# Patient Record
Sex: Female | Born: 1940 | Race: White | Hispanic: No | Marital: Single | Smoking: Never smoker
Health system: Southern US, Community
[De-identification: ages and names within clinical notes are randomized; demographics above are authoritative.]

## PROBLEM LIST (undated history)

## (undated) DIAGNOSIS — I10 Essential (primary) hypertension: Secondary | ICD-10-CM

## (undated) DIAGNOSIS — E7800 Pure hypercholesterolemia, unspecified: Secondary | ICD-10-CM

## (undated) DIAGNOSIS — I999 Unspecified disorder of circulatory system: Secondary | ICD-10-CM

## (undated) DIAGNOSIS — E119 Type 2 diabetes mellitus without complications: Secondary | ICD-10-CM

## (undated) HISTORY — PX: VASCULAR SURGERY: SHX849

---

## 2015-06-07 ENCOUNTER — Emergency Department (HOSPITAL_BASED_OUTPATIENT_CLINIC_OR_DEPARTMENT_OTHER)
Admission: EM | Admit: 2015-06-07 | Discharge: 2015-06-07 | Disposition: A | Discharge status: Home / Self Care | Payer: Medicare Other | Source: Home / Self Care | Attending: Emergency Medicine | Admitting: Emergency Medicine

## 2015-06-07 ENCOUNTER — Encounter (HOSPITAL_BASED_OUTPATIENT_CLINIC_OR_DEPARTMENT_OTHER): Payer: Self-pay | Admitting: Emergency Medicine

## 2015-06-07 DIAGNOSIS — I10 Essential (primary) hypertension: Secondary | ICD-10-CM | POA: Diagnosis present

## 2015-06-07 DIAGNOSIS — L0201 Cutaneous abscess of face: Secondary | ICD-10-CM | POA: Diagnosis present

## 2015-06-07 DIAGNOSIS — Z66 Do not resuscitate: Secondary | ICD-10-CM | POA: Diagnosis present

## 2015-06-07 DIAGNOSIS — N179 Acute kidney failure, unspecified: Secondary | ICD-10-CM | POA: Diagnosis not present

## 2015-06-07 DIAGNOSIS — L03211 Cellulitis of face: Secondary | ICD-10-CM | POA: Diagnosis present

## 2015-06-07 DIAGNOSIS — Z7902 Long term (current) use of antithrombotics/antiplatelets: Secondary | ICD-10-CM

## 2015-06-07 DIAGNOSIS — I739 Peripheral vascular disease, unspecified: Secondary | ICD-10-CM | POA: Diagnosis present

## 2015-06-07 DIAGNOSIS — Z8639 Personal history of other endocrine, nutritional and metabolic disease: Secondary | ICD-10-CM | POA: Insufficient documentation

## 2015-06-07 DIAGNOSIS — Z885 Allergy status to narcotic agent status: Secondary | ICD-10-CM

## 2015-06-07 DIAGNOSIS — M549 Dorsalgia, unspecified: Secondary | ICD-10-CM

## 2015-06-07 DIAGNOSIS — J9811 Atelectasis: Secondary | ICD-10-CM | POA: Diagnosis present

## 2015-06-07 DIAGNOSIS — A419 Sepsis, unspecified organism: Principal | ICD-10-CM | POA: Diagnosis present

## 2015-06-07 DIAGNOSIS — Z79899 Other long term (current) drug therapy: Secondary | ICD-10-CM

## 2015-06-07 DIAGNOSIS — E119 Type 2 diabetes mellitus without complications: Secondary | ICD-10-CM

## 2015-06-07 DIAGNOSIS — R51 Headache: Secondary | ICD-10-CM | POA: Insufficient documentation

## 2015-06-07 DIAGNOSIS — K529 Noninfective gastroenteritis and colitis, unspecified: Secondary | ICD-10-CM | POA: Diagnosis present

## 2015-06-07 DIAGNOSIS — E785 Hyperlipidemia, unspecified: Secondary | ICD-10-CM | POA: Diagnosis present

## 2015-06-07 DIAGNOSIS — E780 Pure hypercholesterolemia: Secondary | ICD-10-CM | POA: Diagnosis present

## 2015-06-07 DIAGNOSIS — Z888 Allergy status to other drugs, medicaments and biological substances status: Secondary | ICD-10-CM

## 2015-06-07 HISTORY — DX: Type 2 diabetes mellitus without complications: E11.9

## 2015-06-07 HISTORY — DX: Essential (primary) hypertension: I10

## 2015-06-07 HISTORY — DX: Pure hypercholesterolemia, unspecified: E78.00

## 2015-06-07 HISTORY — DX: Unspecified disorder of circulatory system: I99.9

## 2015-06-07 LAB — CBC WITH DIFFERENTIAL/PLATELET
BASOS ABS: 0 10*3/uL (ref 0.0–0.1)
Basophils Relative: 0 % (ref 0–1)
Eosinophils Absolute: 0 10*3/uL (ref 0.0–0.7)
Eosinophils Relative: 0 % (ref 0–5)
HEMATOCRIT: 31.9 % — AB (ref 36.0–46.0)
HEMOGLOBIN: 9.8 g/dL — AB (ref 12.0–15.0)
Lymphocytes Relative: 9 % — ABNORMAL LOW (ref 12–46)
Lymphs Abs: 1.2 10*3/uL (ref 0.7–4.0)
MCH: 28.1 pg (ref 26.0–34.0)
MCHC: 30.7 g/dL (ref 30.0–36.0)
MCV: 91.4 fL (ref 78.0–100.0)
MONO ABS: 1.3 10*3/uL — AB (ref 0.1–1.0)
MONOS PCT: 9 % (ref 3–12)
Neutro Abs: 11.4 10*3/uL — ABNORMAL HIGH (ref 1.7–7.7)
Neutrophils Relative %: 82 % — ABNORMAL HIGH (ref 43–77)
Platelets: 174 10*3/uL (ref 150–400)
RBC: 3.49 MIL/uL — ABNORMAL LOW (ref 3.87–5.11)
RDW: 15.6 % — ABNORMAL HIGH (ref 11.5–15.5)
WBC: 13.9 10*3/uL — AB (ref 4.0–10.5)

## 2015-06-07 LAB — BASIC METABOLIC PANEL
ANION GAP: 11 (ref 5–15)
BUN: 26 mg/dL — AB (ref 6–20)
CHLORIDE: 101 mmol/L (ref 101–111)
CO2: 26 mmol/L (ref 22–32)
Calcium: 8.3 mg/dL — ABNORMAL LOW (ref 8.9–10.3)
Creatinine, Ser: 1.36 mg/dL — ABNORMAL HIGH (ref 0.44–1.00)
GFR calc non Af Amer: 37 mL/min — ABNORMAL LOW (ref >60)
GFR, EST AFRICAN AMERICAN: 43 mL/min — AB (ref >60)
Glucose, Bld: 140 mg/dL — ABNORMAL HIGH (ref 65–99)
Potassium: 3.7 mmol/L (ref 3.5–5.1)
Sodium: 138 mmol/L (ref 135–145)

## 2015-06-07 MED ORDER — SODIUM CHLORIDE 0.9 % IV SOLN
INTRAVENOUS | Status: DC
  Administered 2015-06-07: 19:00:00 via INTRAVENOUS

## 2015-06-07 MED ORDER — CEPHALEXIN 500 MG PO CAPS: 500.0000 mg | ORAL_CAPSULE | Freq: Four times a day (QID) | ORAL | Status: DC

## 2015-06-07 MED ORDER — DOXYCYCLINE HYCLATE 100 MG PO CAPS: 100.0000 mg | ORAL_CAPSULE | Freq: Two times a day (BID) | ORAL | Status: DC

## 2015-06-07 MED ORDER — VANCOMYCIN HCL IN DEXTROSE 1-5 GM/200ML-% IV SOLN
1000.0000 mg | Freq: Once | INTRAVENOUS | Status: AC
  Administered 2015-06-07: 1000 mg via INTRAVENOUS
  Filled 2015-06-07: qty 200

## 2015-06-07 NOTE — ED Notes (Signed)
Pt states she woke up Thursday with mild swelling noted to left cheek, and swelling has worsened over past 2 days and now today pt has noticed mild bruising appearing on left side of face.

## 2015-06-07 NOTE — ED Provider Notes (Signed)
CSN: [id number]     Arrival date & time 06/07/15  1734 History  This chart was scribed for Vanetta Mulders, MD by Merlene Laughter, ED Scribe. This patient was seen in room MH08/MH08 and the patient's care was started at 6:43 PM.   Chief Complaint  Patient presents with  . Facial Swelling   Patient is a 74 y.o. female presenting with facial injury. The history is provided by the patient. No language interpreter was used.  Facial Injury Location:  L cheek Time since incident:  2 days Pain details:    Quality: swelling.   Severity:  No pain   Timing:  Constant   Progression:  Worsening Chronicity:  New Relieved by:  None tried Worsened by:  Nothing tried Ineffective treatments:  None tried Associated symptoms: headaches   Associated symptoms: no nausea, no rhinorrhea and no vomiting     HPI Comments: Victoria Luna is a 74 y.o. female with a PMHx of hypertension and diabetes, who presents to the Emergency Department complaining of left sided facial swelling onset 2 days ago. Patient states she found a dark spot on the left corner of her lip a few days ago and noticed blood on her pillow this morning. Patient also has a small possible pus pocket on the left side of her neck. Patient denies associated dental pain, fever, trouble swallowing, eye disturbances. Patient has no upper teeth. Pt is currently on Plavix medication. Her PCP is Dr. Derrell Lolling at [facility]. She states she is seen by a dermatologist for removal of skin cancer.  Past Medical History  Diagnosis Date  . Hypertension   . High cholesterol   . Vascular disease   . Diabetes mellitus without complication    Past Surgical History  Procedure Laterality Date  . Vascular surgery     No family history on file. History  Substance Use Topics  . Smoking status: Never Smoker   . Smokeless tobacco: Not on file  . Alcohol Use: No   OB History    No data available     Review of Systems  Constitutional: Positive for chills. Negative for  fever.  HENT: Negative for rhinorrhea, sore throat and trouble swallowing.   Eyes: Negative for visual disturbance.  Respiratory: Negative for cough and shortness of breath.   Cardiovascular: Negative for chest pain and leg swelling.  Gastrointestinal: Negative for nausea, vomiting, abdominal pain and diarrhea.  Genitourinary: Negative for dysuria and hematuria.  Musculoskeletal: Positive for back pain.  Skin: Positive for rash.  Neurological: Positive for headaches.  Hematological: Does not bruise/bleed easily.  Psychiatric/Behavioral: Negative for confusion.   Allergies  Codeine  Home Medications   Prior to Admission medications   Medication Sig Start Date End Date Taking? Authorizing Provider  clopidogrel (PLAVIX) 75 MG tablet Take 75 mg by mouth daily.   Yes Historical Provider, MD  metoprolol tartrate (LOPRESSOR) 25 MG tablet Take 25 mg by mouth 2 (two) times daily.   Yes Historical Provider, MD  cephALEXin (KEFLEX) 500 MG capsule Take 1 capsule (500 mg total) by mouth 4 (four) times daily. 06/07/15   Vanetta Mulders, MD  doxycycline (VIBRAMYCIN) 100 MG capsule Take 1 capsule (100 mg total) by mouth 2 (two) times daily. 06/07/15   Vanetta Mulders, MD   Triage Vitals: BP 125/42 mmHg  Pulse 64  Temp(Src) 98.5 F (36.9 C) (Oral)  Resp 18  Ht 4\' 11"  (1.499 m)  Wt 130 lb (58.968 kg)  BMI 26.24 kg/m2  SpO2 96%  Physical  Exam  Constitutional: She is oriented to person, place, and time. She appears well-developed and well-nourished. No distress.     HENT:  Head: Normocephalic and atraumatic.  Mouth/Throat: Oropharynx is clear and moist.  Eyes: Conjunctivae and EOM are normal. Pupils are equal, round, and reactive to light. No scleral icterus.  Eyes track normal  Neck: Neck supple. No tracheal deviation present.  Cardiovascular: Normal rate, regular rhythm and normal heart sounds.   No murmur heard. Pulmonary/Chest: Effort normal and breath sounds normal. No respiratory  distress.  Abdominal: Bowel sounds are normal. There is no tenderness.  Musculoskeletal: Normal range of motion. She exhibits no edema.  No ankle swelling  Lymphadenopathy:    She has no cervical adenopathy.  Neurological: She is alert and oriented to person, place, and time. No cranial nerve deficit. She exhibits normal muscle tone. Coordination normal.  Skin: Skin is warm and dry.  Area of induration and erythema about 5 cm on left side of face  Psychiatric: She has a normal mood and affect. Her behavior is normal.  Nursing note and vitals reviewed.  ED Course  Procedures  DIAGNOSTIC STUDIES: Oxygen Saturation is 96% on RA, adequate by my interpretation.    COORDINATION OF CARE: 6:53 PM- Discussed plan to order diagnostic lab work. Will give IV fluids and antibiotics and discharge with abx.  Pt advised of plan for treatment and pt agrees.  Labs Review Labs Reviewed  BASIC METABOLIC PANEL - Abnormal; Notable for the following:    Glucose, Bld 140 (*)    BUN 26 (*)    Creatinine, Ser 1.36 (*)    Calcium 8.3 (*)    GFR calc non Af Amer 37 (*)    GFR calc Af Amer 43 (*)    All other components within normal limits  CBC WITH DIFFERENTIAL/PLATELET - Abnormal; Notable for the following:    WBC 13.9 (*)    RBC 3.49 (*)    Hemoglobin 9.8 (*)    HCT 31.9 (*)    RDW 15.6 (*)    Neutrophils Relative % 82 (*)    Neutro Abs 11.4 (*)    Lymphocytes Relative 9 (*)    Monocytes Absolute 1.3 (*)    All other components within normal limits   Results for orders placed or performed during the hospital encounter of 06/07/15  Basic metabolic panel  Result Value Ref Range   Sodium 138 135 - 145 mmol/L   Potassium 3.7 3.5 - 5.1 mmol/L   Chloride 101 101 - 111 mmol/L   CO2 26 22 - 32 mmol/L   Glucose, Bld 140 (H) 65 - 99 mg/dL   BUN 26 (H) 6 - 20 mg/dL   Creatinine, Ser 3.81 (H) 0.44 - 1.00 mg/dL   Calcium 8.3 (L) 8.9 - 10.3 mg/dL   GFR calc non Af Amer 37 (L) >60 mL/min   GFR calc Af  Amer 43 (L) >60 mL/min   Anion gap 11 5 - 15  CBC with Differential/Platelet  Result Value Ref Range   WBC 13.9 (H) 4.0 - 10.5 K/uL   RBC 3.49 (L) 3.87 - 5.11 MIL/uL   Hemoglobin 9.8 (L) 12.0 - 15.0 g/dL   HCT 01.7 (L) 51.0 - 25.8 %   MCV 91.4 78.0 - 100.0 fL   MCH 28.1 26.0 - 34.0 pg   MCHC 30.7 30.0 - 36.0 g/dL   RDW 52.7 (H) 78.2 - 42.3 %   Platelets 174 150 - 400 K/uL   Neutrophils  Relative % 82 (H) 43 - 77 %   Neutro Abs 11.4 (H) 1.7 - 7.7 K/uL   Lymphocytes Relative 9 (L) 12 - 46 %   Lymphs Abs 1.2 0.7 - 4.0 K/uL   Monocytes Relative 9 3 - 12 %   Monocytes Absolute 1.3 (H) 0.1 - 1.0 K/uL   Eosinophils Relative 0 0 - 5 %   Eosinophils Absolute 0.0 0.0 - 0.7 K/uL   Basophils Relative 0 0 - 1 %   Basophils Absolute 0.0 0.0 - 0.1 K/uL     Imaging Review No results found.   EKG Interpretation None      MDM   Final diagnoses:  Facial cellulitis   Patient with left-sided facial swelling. Seems to be due to a skin lesion. Not consistent with oral infection. Patient received the 1 g of vancomycin here showing some signs of improvement. Will be continued on Keflex and doxycycline at home. Patient will return for any new or worse symptoms at all over the next couple days. Otherwise will follow-up with her regular doctor. Patient with some leukocytosis no significant anemia. No significant electrolyte abnormalities. Patient nontoxic no acute distress. Is not febrile. Not tachycardic not hypotensive.  I personally performed the services described in this documentation, which was scribed in my presence. The recorded information has been reviewed and is accurate.     Vanetta Mulders, MD 06/07/15 2031

## 2015-06-07 NOTE — Discharge Instructions (Signed)
Cellulitis Cellulitis is an infection of the skin and the tissue under the skin. The infected area is usually red and tender. This happens most often in the arms and lower legs. HOME CARE   Take your antibiotic medicine as told. Finish the medicine even if you start to feel better.  Keep the infected arm or leg raised (elevated).  Put a warm cloth on the area up to 4 times per day.  Only take medicines as told by your doctor.  Keep all doctor visits as told. GET HELP IF:  You see red streaks on the skin coming from the infected area.  Your red area gets bigger or turns a dark color.  Your bone or joint under the infected area is painful after the skin heals.  Your infection comes back in the same area or different area.  You have a puffy (swollen) bump in the infected area.  You have new symptoms.  You have a fever. GET HELP RIGHT AWAY IF:   You feel very sleepy.  You throw up (vomit) or have watery poop (diarrhea).  You feel sick and have muscle aches and pains. MAKE SURE YOU:   Understand these instructions.  Will watch your condition.  Will get help right away if you are not doing well or get worse. Document Released: 05/17/2008 Document Revised: 04/15/2014 Document Reviewed: 02/14/2012 [facility] Patient Information 2015 [place], [place]. This information is not intended to replace advice given to you by your health care provider. Make sure you discuss any questions you have with your health care provider.  Take both antibodies as directed. Return if symptoms get worse at all. Follow-up with your Dr. make an appointment to be seen next week.

## 2015-06-08 ENCOUNTER — Encounter (HOSPITAL_BASED_OUTPATIENT_CLINIC_OR_DEPARTMENT_OTHER): Payer: Self-pay | Admitting: Emergency Medicine

## 2015-06-08 ENCOUNTER — Inpatient Hospital Stay (HOSPITAL_COMMUNITY): Payer: Medicare Other

## 2015-06-08 ENCOUNTER — Emergency Department (HOSPITAL_BASED_OUTPATIENT_CLINIC_OR_DEPARTMENT_OTHER): Payer: Medicare Other

## 2015-06-08 ENCOUNTER — Inpatient Hospital Stay (HOSPITAL_BASED_OUTPATIENT_CLINIC_OR_DEPARTMENT_OTHER)
Admission: EM | Admit: 2015-06-08 | Discharge: 2015-06-13 | DRG: 872 | Disposition: A | Discharge status: Home Health | Payer: Medicare Other | Attending: Internal Medicine | Admitting: Internal Medicine

## 2015-06-08 DIAGNOSIS — N179 Acute kidney failure, unspecified: Secondary | ICD-10-CM | POA: Insufficient documentation

## 2015-06-08 DIAGNOSIS — R234 Changes in skin texture: Secondary | ICD-10-CM

## 2015-06-08 DIAGNOSIS — E119 Type 2 diabetes mellitus without complications: Secondary | ICD-10-CM

## 2015-06-08 DIAGNOSIS — L03211 Cellulitis of face: Secondary | ICD-10-CM | POA: Diagnosis present

## 2015-06-08 DIAGNOSIS — J9811 Atelectasis: Secondary | ICD-10-CM | POA: Diagnosis present

## 2015-06-08 DIAGNOSIS — E785 Hyperlipidemia, unspecified: Secondary | ICD-10-CM | POA: Diagnosis present

## 2015-06-08 DIAGNOSIS — Z66 Do not resuscitate: Secondary | ICD-10-CM | POA: Diagnosis present

## 2015-06-08 DIAGNOSIS — Z888 Allergy status to other drugs, medicaments and biological substances status: Secondary | ICD-10-CM | POA: Diagnosis not present

## 2015-06-08 DIAGNOSIS — A419 Sepsis, unspecified organism: Secondary | ICD-10-CM

## 2015-06-08 DIAGNOSIS — R0600 Dyspnea, unspecified: Secondary | ICD-10-CM

## 2015-06-08 DIAGNOSIS — Z885 Allergy status to narcotic agent status: Secondary | ICD-10-CM | POA: Diagnosis not present

## 2015-06-08 DIAGNOSIS — I10 Essential (primary) hypertension: Secondary | ICD-10-CM | POA: Diagnosis present

## 2015-06-08 DIAGNOSIS — L0201 Cutaneous abscess of face: Secondary | ICD-10-CM | POA: Diagnosis present

## 2015-06-08 DIAGNOSIS — R5381 Other malaise: Secondary | ICD-10-CM | POA: Insufficient documentation

## 2015-06-08 DIAGNOSIS — I739 Peripheral vascular disease, unspecified: Secondary | ICD-10-CM | POA: Diagnosis present

## 2015-06-08 DIAGNOSIS — R0902 Hypoxemia: Secondary | ICD-10-CM

## 2015-06-08 DIAGNOSIS — Z7902 Long term (current) use of antithrombotics/antiplatelets: Secondary | ICD-10-CM | POA: Diagnosis not present

## 2015-06-08 DIAGNOSIS — K529 Noninfective gastroenteritis and colitis, unspecified: Secondary | ICD-10-CM | POA: Diagnosis present

## 2015-06-08 DIAGNOSIS — L0390 Cellulitis, unspecified: Secondary | ICD-10-CM | POA: Insufficient documentation

## 2015-06-08 DIAGNOSIS — E780 Pure hypercholesterolemia: Secondary | ICD-10-CM | POA: Diagnosis present

## 2015-06-08 LAB — CBC WITH DIFFERENTIAL/PLATELET
BASOS PCT: 0 % (ref 0–1)
Basophils Absolute: 0 10*3/uL (ref 0.0–0.1)
Basophils Absolute: 0 10*3/uL (ref 0.0–0.1)
Basophils Relative: 0 % (ref 0–1)
EOS ABS: 0.1 10*3/uL (ref 0.0–0.7)
Eosinophils Absolute: 0 10*3/uL (ref 0.0–0.7)
Eosinophils Relative: 0 % (ref 0–5)
Eosinophils Relative: 1 % (ref 0–5)
HCT: 30.1 % — ABNORMAL LOW (ref 36.0–46.0)
HCT: 28.6 % — ABNORMAL LOW (ref 36.0–46.0)
HEMOGLOBIN: 9.3 g/dL — AB (ref 12.0–15.0)
Hemoglobin: 8.9 g/dL — ABNORMAL LOW (ref 12.0–15.0)
LYMPHS ABS: 1 10*3/uL (ref 0.7–4.0)
LYMPHS PCT: 9 % — AB (ref 12–46)
Lymphocytes Relative: 16 % (ref 12–46)
Lymphs Abs: 1.3 10*3/uL (ref 0.7–4.0)
MCH: 28.1 pg (ref 26.0–34.0)
MCH: 27.6 pg (ref 26.0–34.0)
MCHC: 30.9 g/dL (ref 30.0–36.0)
MCHC: 31.1 g/dL (ref 30.0–36.0)
MCV: 90.9 fL (ref 78.0–100.0)
MCV: 88.5 fL (ref 78.0–100.0)
Monocytes Absolute: 0.9 10*3/uL (ref 0.1–1.0)
Monocytes Absolute: 0.6 10*3/uL (ref 0.1–1.0)
Monocytes Relative: 8 % (ref 3–12)
Monocytes Relative: 8 % (ref 3–12)
NEUTROS PCT: 83 % — AB (ref 43–77)
Neutro Abs: 8.7 10*3/uL — ABNORMAL HIGH (ref 1.7–7.7)
Neutro Abs: 6 10*3/uL (ref 1.7–7.7)
Neutrophils Relative %: 75 % (ref 43–77)
PLATELETS: 153 10*3/uL (ref 150–400)
Platelets: 162 10*3/uL (ref 150–400)
RBC: 3.31 MIL/uL — AB (ref 3.87–5.11)
RBC: 3.23 MIL/uL — AB (ref 3.87–5.11)
RDW: 15.6 % — ABNORMAL HIGH (ref 11.5–15.5)
RDW: 15.7 % — ABNORMAL HIGH (ref 11.5–15.5)
WBC: 10.6 10*3/uL — ABNORMAL HIGH (ref 4.0–10.5)
WBC: 8 10*3/uL (ref 4.0–10.5)

## 2015-06-08 LAB — BASIC METABOLIC PANEL
Anion gap: 10 (ref 5–15)
BUN: 27 mg/dL — AB (ref 6–20)
CALCIUM: 8 mg/dL — AB (ref 8.9–10.3)
CO2: 25 mmol/L (ref 22–32)
Chloride: 103 mmol/L (ref 101–111)
Creatinine, Ser: 1.31 mg/dL — ABNORMAL HIGH (ref 0.44–1.00)
GFR calc Af Amer: 45 mL/min — ABNORMAL LOW (ref >60)
GFR calc non Af Amer: 39 mL/min — ABNORMAL LOW (ref >60)
GLUCOSE: 138 mg/dL — AB (ref 65–99)
Potassium: 3.3 mmol/L — ABNORMAL LOW (ref 3.5–5.1)
Sodium: 138 mmol/L (ref 135–145)

## 2015-06-08 LAB — APTT: APTT: 30 s (ref 24–37)

## 2015-06-08 LAB — GLUCOSE, CAPILLARY: Glucose-Capillary: 122 mg/dL — ABNORMAL HIGH (ref 65–99)

## 2015-06-08 LAB — COMPREHENSIVE METABOLIC PANEL
ALK PHOS: 67 U/L (ref 38–126)
ALT: 10 U/L — AB (ref 14–54)
ANION GAP: 7 (ref 5–15)
AST: 16 U/L (ref 15–41)
Albumin: 2.8 g/dL — ABNORMAL LOW (ref 3.5–5.0)
BILIRUBIN TOTAL: 0.4 mg/dL (ref 0.3–1.2)
BUN: 24 mg/dL — AB (ref 6–20)
CALCIUM: 7.9 mg/dL — AB (ref 8.9–10.3)
CO2: 28 mmol/L (ref 22–32)
Chloride: 104 mmol/L (ref 101–111)
Creatinine, Ser: 1.4 mg/dL — ABNORMAL HIGH (ref 0.44–1.00)
GFR calc Af Amer: 41 mL/min — ABNORMAL LOW (ref >60)
GFR, EST NON AFRICAN AMERICAN: 36 mL/min — AB (ref >60)
Glucose, Bld: 162 mg/dL — ABNORMAL HIGH (ref 65–99)
Potassium: 3.9 mmol/L (ref 3.5–5.1)
Sodium: 139 mmol/L (ref 135–145)
TOTAL PROTEIN: 5.8 g/dL — AB (ref 6.5–8.1)

## 2015-06-08 LAB — PROTIME-INR
INR: 1.19 (ref 0.00–1.49)
Prothrombin Time: 15.3 seconds — ABNORMAL HIGH (ref 11.6–15.2)

## 2015-06-08 LAB — I-STAT CG4 LACTIC ACID, ED: Lactic Acid, Venous: 0.69 mmol/L (ref 0.5–2.0)

## 2015-06-08 LAB — LACTIC ACID, PLASMA: Lactic Acid, Venous: 0.9 mmol/L (ref 0.5–2.0)

## 2015-06-08 LAB — PROCALCITONIN: Procalcitonin: 0.38 ng/mL

## 2015-06-08 MED ORDER — MORPHINE SULFATE 4 MG/ML IJ SOLN
4.0000 mg | INTRAMUSCULAR | Status: DC | PRN
  Administered 2015-06-08: 4 mg via INTRAVENOUS
  Filled 2015-06-08: qty 1

## 2015-06-08 MED ORDER — ACETAMINOPHEN 325 MG PO TABS
650.0000 mg | ORAL_TABLET | Freq: Four times a day (QID) | ORAL | Status: DC | PRN
  Administered 2015-06-09 – 2015-06-10 (×2): 650 mg via ORAL
  Filled 2015-06-08 (×2): qty 2

## 2015-06-08 MED ORDER — VANCOMYCIN HCL IN DEXTROSE 1-5 GM/200ML-% IV SOLN
1000.0000 mg | Freq: Once | INTRAVENOUS | Status: AC
  Administered 2015-06-08: 1000 mg via INTRAVENOUS
  Filled 2015-06-08: qty 200

## 2015-06-08 MED ORDER — SODIUM CHLORIDE 0.9 % IV SOLN
INTRAVENOUS | Status: DC
  Administered 2015-06-08 – 2015-06-10 (×4): via INTRAVENOUS

## 2015-06-08 MED ORDER — ACETAMINOPHEN 650 MG RE SUPP: 650.0000 mg | Freq: Four times a day (QID) | RECTAL | Status: DC | PRN

## 2015-06-08 MED ORDER — SODIUM CHLORIDE 0.9 % IJ SOLN
3.0000 mL | Freq: Two times a day (BID) | INTRAMUSCULAR | Status: DC
  Administered 2015-06-08 – 2015-06-13 (×3): 3 mL via INTRAVENOUS

## 2015-06-08 MED ORDER — METOPROLOL TARTRATE 25 MG PO TABS
25.0000 mg | ORAL_TABLET | Freq: Two times a day (BID) | ORAL | Status: DC
  Administered 2015-06-08: 25 mg via ORAL
  Filled 2015-06-08 (×3): qty 1

## 2015-06-08 MED ORDER — ONDANSETRON HCL 4 MG PO TABS: 4.0000 mg | ORAL_TABLET | Freq: Four times a day (QID) | ORAL | Status: DC | PRN

## 2015-06-08 MED ORDER — PIPERACILLIN-TAZOBACTAM 3.375 G IVPB 30 MIN
3.3750 g | Freq: Once | INTRAVENOUS | Status: AC
  Administered 2015-06-08: 3.375 g via INTRAVENOUS
  Filled 2015-06-08: qty 50

## 2015-06-08 MED ORDER — VANCOMYCIN HCL IN DEXTROSE 1-5 GM/200ML-% IV SOLN
1000.0000 mg | INTRAVENOUS | Status: DC
  Administered 2015-06-11 – 2015-06-12 (×2): 1000 mg via INTRAVENOUS
  Filled 2015-06-08 (×4): qty 200

## 2015-06-08 MED ORDER — POTASSIUM CHLORIDE CRYS ER 20 MEQ PO TBCR
40.0000 meq | EXTENDED_RELEASE_TABLET | Freq: Once | ORAL | Status: AC
  Administered 2015-06-08: 40 meq via ORAL
  Filled 2015-06-08: qty 2

## 2015-06-08 MED ORDER — INSULIN ASPART 100 UNIT/ML ~~LOC~~ SOLN
0.0000 [IU] | Freq: Three times a day (TID) | SUBCUTANEOUS | Status: DC
  Administered 2015-06-10: 1 [IU] via SUBCUTANEOUS
  Administered 2015-06-11: 2 [IU] via SUBCUTANEOUS
  Administered 2015-06-12 – 2015-06-13 (×2): 1 [IU] via SUBCUTANEOUS

## 2015-06-08 MED ORDER — PIPERACILLIN-TAZOBACTAM 3.375 G IVPB
3.3750 g | Freq: Three times a day (TID) | INTRAVENOUS | Status: DC
  Administered 2015-06-09 – 2015-06-12 (×10): 3.375 g via INTRAVENOUS
  Filled 2015-06-08 (×14): qty 50

## 2015-06-08 MED ORDER — CLOPIDOGREL BISULFATE 75 MG PO TABS
75.0000 mg | ORAL_TABLET | Freq: Every day | ORAL | Status: DC
  Administered 2015-06-09 – 2015-06-13 (×5): 75 mg via ORAL
  Filled 2015-06-08 (×6): qty 1

## 2015-06-08 MED ORDER — ONDANSETRON HCL 4 MG/2ML IJ SOLN
4.0000 mg | Freq: Three times a day (TID) | INTRAMUSCULAR | Status: DC | PRN
  Filled 2015-06-08: qty 2

## 2015-06-08 MED ORDER — ONDANSETRON HCL 4 MG/2ML IJ SOLN
4.0000 mg | Freq: Once | INTRAMUSCULAR | Status: AC
  Administered 2015-06-08: 4 mg via INTRAVENOUS
  Filled 2015-06-08: qty 2

## 2015-06-08 MED ORDER — INSULIN ASPART 100 UNIT/ML ~~LOC~~ SOLN: 0.0000 [IU] | Freq: Every day | SUBCUTANEOUS | Status: DC

## 2015-06-08 MED ORDER — ONDANSETRON HCL 4 MG/2ML IJ SOLN: 4.0000 mg | Freq: Four times a day (QID) | INTRAMUSCULAR | Status: DC | PRN

## 2015-06-08 MED ORDER — HEPARIN SODIUM (PORCINE) 5000 UNIT/ML IJ SOLN
5000.0000 [IU] | Freq: Three times a day (TID) | INTRAMUSCULAR | Status: DC
  Administered 2015-06-08 – 2015-06-10 (×5): 5000 [IU] via SUBCUTANEOUS
  Filled 2015-06-08 (×8): qty 1

## 2015-06-08 NOTE — ED Provider Notes (Signed)
CSN: [id number]     Arrival date & time 06/08/15  1404 History   First MD Initiated Contact with Patient 06/08/15 1428     Chief Complaint  Patient presents with  . Facial Swelling     (Consider location/radiation/quality/duration/timing/severity/associated sxs/prior Treatment) HPI   Blood pressure 106/30, pulse 61, temperature 98.5 F (36.9 C), temperature source Oral, resp. rate 18, height 4\' 11"  (1.499 m), weight 130 lb (58.968 kg), SpO2 97 %.  Victoria Luna is a 74 y.o. female with past medical history significant for hypertension, high cholesterol, non-insulin-dependent diabetes complaining of worsening cellulitis to left cheek. Patient was seen for similar yesterday, she was given a dose of vancomycin IV and sent home on Keflex and doxycycline. Patient states that she is taken 3 doses or oral meds and feels that the cellulitis is spreading and getting closer to the eye. Patient denies fever, chills, nausea, vomiting, change in her vision, pain with eye movement or double vision. She states that the area started off as a small pimple, she picked at it several days ago and it is progressively gotten worse.  Agents notes that she started coughing this morning, this is a dry cough. She has a pleuritic chest pain only when she coughs. She denies shortness of breath, dyspnea on exertion, increasing peripheral edema, PND, history of any heart issues.  Past Medical History  Diagnosis Date  . Hypertension   . High cholesterol   . Vascular disease   . Diabetes mellitus without complication    Past Surgical History  Procedure Laterality Date  . Vascular surgery     History reviewed. No pertinent family history. History  Substance Use Topics  . Smoking status: Never Smoker   . Smokeless tobacco: Not on file  . Alcohol Use: No   OB History    No data available     Review of Systems  10 systems reviewed and found to be negative, except as noted in the HPI.   Allergies  Codeine  and Levaquin  Home Medications   Prior to Admission medications   Medication Sig Start Date End Date Taking? Authorizing Provider  cephALEXin (KEFLEX) 500 MG capsule Take 1 capsule (500 mg total) by mouth 4 (four) times daily. 06/07/15   Vanetta Mulders, MD  clopidogrel (PLAVIX) 75 MG tablet Take 75 mg by mouth daily.    Historical Provider, MD  doxycycline (VIBRAMYCIN) 100 MG capsule Take 1 capsule (100 mg total) by mouth 2 (two) times daily. 06/07/15   Vanetta Mulders, MD  metoprolol tartrate (LOPRESSOR) 25 MG tablet Take 25 mg by mouth 2 (two) times daily.    Historical Provider, MD   BP 106/39 mmHg  Pulse 59  Temp(Src) 98.5 F (36.9 C) (Oral)  Resp 18  Ht 4\' 11"  (1.499 m)  Wt 130 lb (58.968 kg)  BMI 26.24 kg/m2  SpO2 97% Physical Exam  Constitutional: She is oriented to person, place, and time. She appears well-developed and well-nourished. No distress.  HENT:  Head: Normocephalic and atraumatic.  Mouth/Throat: Oropharynx is clear and moist.  Eyes: Conjunctivae and EOM are normal. Pupils are equal, round, and reactive to light.  Neck: Normal range of motion. Neck supple.  Cardiovascular: Normal rate, regular rhythm and intact distal pulses.   Pulmonary/Chest: Effort normal and breath sounds normal. No stridor. No respiratory distress. She has no wheezes. She has no rales. She exhibits no tenderness.  Abdominal: Soft. Bowel sounds are normal. She exhibits no distension and no mass. There is no  tenderness. There is no rebound and no guarding.  Musculoskeletal: Normal range of motion. She exhibits no edema or tenderness.  No calf asymmetry, superficial collaterals, palpable cords, edema, Homans sign negative bilaterally.    Neurological: She is alert and oriented to person, place, and time.  Skin: Rash noted.  Patient has 2 scabs to her face, one is on the right cheek, one is on the left cheek near the upper lip. On the left face patient has a 8 cm area of induration. Her extra  ocular movements are intact without pain, diplopia or double vision. Focal fluctuance, patient is very tender to palpation.  Psychiatric: She has a normal mood and affect.  Nursing note and vitals reviewed.   ED Course  Procedures (including critical care time) Labs Review Labs Reviewed  CBC WITH DIFFERENTIAL/PLATELET - Abnormal; Notable for the following:    WBC 10.6 (*)    RBC 3.31 (*)    Hemoglobin 9.3 (*)    HCT 30.1 (*)    RDW 15.6 (*)    Neutrophils Relative % 83 (*)    Neutro Abs 8.7 (*)    Lymphocytes Relative 9 (*)    All other components within normal limits  BASIC METABOLIC PANEL - Abnormal; Notable for the following:    Potassium 3.3 (*)    Glucose, Bld 138 (*)    BUN 27 (*)    Creatinine, Ser 1.31 (*)    Calcium 8.0 (*)    GFR calc non Af Amer 39 (*)    GFR calc Af Amer 45 (*)    All other components within normal limits  CULTURE, BLOOD (ROUTINE X 2)  CULTURE, BLOOD (ROUTINE X 2)  I-STAT CG4 LACTIC ACID, ED  I-STAT CG4 LACTIC ACID, ED    Imaging Review Dg Chest Port 1 View  06/08/2015   CLINICAL DATA:  Shortness of breath.  Hypoxia.  Symptoms for 1 day.  EXAM: PORTABLE CHEST - 1 VIEW  COMPARISON:  None.  FINDINGS: The heart is at the upper limits of normal in size. There is atherosclerosis of the thoracic aorta. There is central bronchial thickening and increased central bronchogenic markings. Ill-defined linear density in the right midlung zone in the region of the minor fissure, may reflect small amount pleural fluid versus scarring. Minimal blunting of left costophrenic angle and minimal increased density in the periphery of the left lower lung zone. No confluent airspace disease. No pneumothorax. No pulmonary edema. There is severe degenerative change about the right shoulder.  IMPRESSION: 1. Ill-defined linear density in the right midlung zone, may reflect scarring or small amount of fluid in the minor fissure. 2. Bronchial thickening and increased central  bronchogenic markings, this may be chronic, however no prior exams are available for comparison. Bronchitis could have a similar appearance.   Electronically Signed   By: Rubye Oaks M.D.   On: 06/08/2015 17:03     EKG Interpretation   Date/Time:  Sunday June 08 2015 18:03:00 EDT Ventricular Rate:  61 PR Interval:  140 QRS Duration: 82 QT Interval:  452 QTC Calculation: 455 R Axis:   -26 Text Interpretation:  Normal sinus rhythm Minimal voltage criteria for  LVH, may be normal variant Abnormal ECG Confirmed by Lincoln Brigham [phone]) on  06/08/2015 6:28:17 PM      MDM   Final diagnoses:  Hypoxia  Facial cellulitis   Filed Vitals:   06/08/15 1600 06/08/15 1630 06/08/15 1700 06/08/15 1830  BP: 113/41 92/36 106/30 106/39  Pulse:  60 63 61 59  Temp:      TempSrc:      Resp:      Height:      Weight:      SpO2: 90% 98% 97% 97%    Medications  morphine 4 MG/ML injection 4 mg (4 mg Intravenous Given 06/08/15 1554)  ondansetron (ZOFRAN) injection 4 mg (not administered)  vancomycin (VANCOCIN) IVPB 1000 mg/200 mL premix (0 mg Intravenous Stopped 06/08/15 1705)  ondansetron (ZOFRAN) injection 4 mg (4 mg Intravenous Given 06/08/15 1554)  potassium chloride SA (K-DUR,KLOR-CON) CR tablet 40 mEq (40 mEq Oral Given 06/08/15 1841)    Victoria Luna is a pleasant 74 y.o. female presenting with worsening facial cellulitis to left cheek, patient has no signs of systemic infection. She is a non-insulin-dependent diabetic. No signs of orbital cellulitis. Blood cultures are drawn, will bolus and administer dose of vancomycin patient will need admission for outpatient treatment failure.  Patient has a small leukocytosis of 10.6, normal lactic acid level her potassium is mildly low at 3.3. Her creatinine is 1.31, we do not have priors for comparison consistent with yesterday's visit.   Patient is found to be hypoxic, 90% on room air. This may be secondary to the narcotics that were given to her for  pain control. Chest x-ray is without infiltrate. Patient ambulates in her pulse ox drops to 85% however she is asymptomatic. Patient is placed on 2 L via nasal cannula and O2 responds nicely.  Case discussed with [place] [facility] hospitalist Dr. Denman George. States that they have no monitored beds. Discussed with family member who accepts transfer to [facility].  Case discussed with [facility] hospitalist Dr. Joseph Art. Tele inpateuint.   This is a shared visit with the attending physician who personally evaluated the patient and agrees with the care plan.      Wynetta Emery, PA-C 06/08/15 1904  Tilden Fossa, MD 06/08/15 [phone]

## 2015-06-08 NOTE — ED Notes (Signed)
Pt ambulated in hall. Resting SpO2 on RA was 94%, HR 74.  After pt ambulating around 200 ft HR 77, SpO2 dropped to 85%.  No increase WOB noted. Pt states "my chest is sore but I feel like that's coming from taking deep breaths."  Placed pt back on 2lpm [place] and SpO2 97%. HR 63.

## 2015-06-08 NOTE — Progress Notes (Signed)
ANTIBIOTIC CONSULT NOTE - INITIAL  Pharmacy Consult for vanc and zosyn Indication: cellulitis  Allergies  Allergen Reactions  . Codeine   . Levaquin [Levofloxacin In D5w]     hallucinations    Patient Measurements: Height: 4' 1.32" (125.3 cm) Weight: 124 lb (56.246 kg) IBW/kg (Calculated) : 20.94   Vital Signs: Temp: 98.5 F (36.9 C) (06/26 2029) Temp Source: Oral (06/26 2029) BP: 127/46 mmHg (06/26 2029) Pulse Rate: 64 (06/26 2029) Intake/Output from previous day:   Intake/Output from this shift:    Labs:  Recent Labs  06/07/15 1909 06/08/15 1430  WBC 13.9* 10.6*  HGB 9.8* 9.3*  PLT 174 162  CREATININE 1.36* 1.31*   Estimated Creatinine Clearance: 20.5 mL/min (by C-G formula based on Cr of 1.31). No results for input(s): VANCOTROUGH, VANCOPEAK, VANCORANDOM, GENTTROUGH, GENTPEAK, GENTRANDOM, TOBRATROUGH, TOBRAPEAK, TOBRARND, AMIKACINPEAK, AMIKACINTROU, AMIKACIN in the last 72 hours.   Microbiology: No results found for this or any previous visit (from the past 720 hour(s)).  Medical History: Past Medical History  Diagnosis Date  . Hypertension   . High cholesterol   . Vascular disease   . Diabetes mellitus without complication     Medications:  Prescriptions prior to admission  Medication Sig Dispense Refill Last Dose  . cephALEXin (KEFLEX) 500 MG capsule Take 1 capsule (500 mg total) by mouth 4 (four) times daily. 28 capsule 0   . clopidogrel (PLAVIX) 75 MG tablet Take 75 mg by mouth daily.     Marland Kitchen doxycycline (VIBRAMYCIN) 100 MG capsule Take 1 capsule (100 mg total) by mouth 2 (two) times daily. 14 capsule 0   . metoprolol tartrate (LOPRESSOR) 25 MG tablet Take 25 mg by mouth 2 (two) times daily.      Assessment: 74 yo lady to start vancomycin and zosyn for cellulitis.  First doses of antibiotics have been ordered.  Goal of Therapy:  Vancomycin trough level 10-15 mcg/ml  Plan:  Zosyn 3.375 gm IV q8 hours Vancomycin 1gm IV q48 hours Monitor renal  function, cultures and clinical course  Thanks for allowing pharmacy to be a part of this patient's care.  Talbert Cage, PharmD Clinical Pharmacist, [phone]  06/08/2015,9:34 PM

## 2015-06-08 NOTE — ED Notes (Signed)
Pt was seen here yesterday and dx with facial cellulitis to L side. Was given IV vancomycin and sent home with 2 PO abx. Returns today because cellulitis is not better, and is spreading up toward her eye.

## 2015-06-08 NOTE — Progress Notes (Signed)
Received report from [place], RN in [facility] for transfer of pt to [phone]. [facility] is in route with pt.

## 2015-06-09 DIAGNOSIS — E119 Type 2 diabetes mellitus without complications: Secondary | ICD-10-CM

## 2015-06-09 DIAGNOSIS — I10 Essential (primary) hypertension: Secondary | ICD-10-CM | POA: Diagnosis present

## 2015-06-09 DIAGNOSIS — E785 Hyperlipidemia, unspecified: Secondary | ICD-10-CM | POA: Diagnosis present

## 2015-06-09 LAB — PROCALCITONIN: PROCALCITONIN: 0.45 ng/mL

## 2015-06-09 LAB — GLUCOSE, CAPILLARY
GLUCOSE-CAPILLARY: 94 mg/dL (ref 65–99)
Glucose-Capillary: 113 mg/dL — ABNORMAL HIGH (ref 65–99)
Glucose-Capillary: 117 mg/dL — ABNORMAL HIGH (ref 65–99)
Glucose-Capillary: 102 mg/dL — ABNORMAL HIGH (ref 65–99)

## 2015-06-09 LAB — LACTIC ACID, PLASMA
LACTIC ACID, VENOUS: 0.6 mmol/L (ref 0.5–2.0)
LACTIC ACID, VENOUS: 0.3 mmol/L — AB (ref 0.5–2.0)
Lactic Acid, Venous: 0.5 mmol/L (ref 0.5–2.0)

## 2015-06-09 LAB — PROTIME-INR
INR: 1.17 (ref 0.00–1.49)
PROTHROMBIN TIME: 15 s (ref 11.6–15.2)

## 2015-06-09 LAB — APTT: aPTT: 30 seconds (ref 24–37)

## 2015-06-09 MED ORDER — SODIUM CHLORIDE 0.9 % IV BOLUS (SEPSIS)
1000.0000 mL | INTRAVENOUS | Status: AC
  Administered 2015-06-09 (×2): 1000 mL via INTRAVENOUS

## 2015-06-09 MED ORDER — SODIUM CHLORIDE 0.9 % IV BOLUS (SEPSIS)
500.0000 mL | Freq: Once | INTRAVENOUS | Status: DC
Start: 2015-06-09 — End: 2015-06-09

## 2015-06-09 MED ORDER — SODIUM CHLORIDE 0.9 % IV BOLUS (SEPSIS): 500.0000 mL | Freq: Once | INTRAVENOUS | Status: DC

## 2015-06-09 NOTE — Progress Notes (Signed)
[facility] PROGRESS NOTE  Victoria Luna ZOX:[id number] DOB: 03/30/1941 DOA: 06/08/2015 PCP: Malka SoJOBE,DANIEL B., MD  Assessment/Plan: 1-sepsis;  Low blood pressure, febrile leukocytosis. Facial cellulitis.  Will order IV bolus.  Repeat lactic acid.  Hold metoprolol.  Continue with IV fluids and IV antibiotics.  Sepsis order set ordered.   Facial cellulitis vs abscess;  Continue with vancomycin and zosyn.  Will consult ENT.   3-HTN; hold metoprolol in setting of infection.   4-History of peripheral vascular disease. Continuing Plavix as home medication.  5. Diabetes mellitus. The patient is not aware of what medication she is taking but will place on sliding scale.  Code Status: DNR Family Communication: care discussed with patient.  Disposition Plan: Remain inpatient.    Consultants:  ENT  Procedures:  none  Antibiotics:  Vancomycin and zosyn  HPI/Subjective: Feeling better. Feels swelling has decrease.   Objective: Filed Vitals:   06/09/15 0900  BP: 88/36  Pulse: 62  Temp: 100.4 F (38 C)  Resp: 18    Intake/Output Summary (Last 24 hours) at 06/09/15 1052 Last data filed at 06/09/15 0917  Gross per 24 hour  Intake  937.5 ml  Output    400 ml  Net  537.5 ml   Filed Weights   06/08/15 1410 06/08/15 2026 06/09/15 0548  Weight: 58.968 kg (130 lb) 56.246 kg (124 lb) 58.378 kg (128 lb 11.2 oz)    Exam:   General:  Alert in no distress.   Cardiovascular: S 1, S 2 RRR  Respiratory: crackles bases.  Abdomen: Bs present, soft, nt  Musculoskeletal: no edema  Skin, face: swelling left side face, redness. Induration 2 to 3 cm  Data Reviewed: Basic Metabolic Panel:  Recent Labs Lab 06/07/15 1909 06/08/15 1430 06/08/15 2150  NA 138 138 139  K 3.7 3.3* 3.9  CL 101 103 104  CO2 26 25 28   GLUCOSE 140* 138* 162*  BUN 26* 27* 24*  CREATININE 1.36* 1.31* 1.40*  CALCIUM 8.3* 8.0* 7.9*   Liver Function Tests:  Recent Labs Lab  06/08/15 2150  AST 16  ALT 10*  ALKPHOS 67  BILITOT 0.4  PROT 5.8*  ALBUMIN 2.8*   No results for input(s): LIPASE, AMYLASE in the last 168 hours. No results for input(s): AMMONIA in the last 168 hours. CBC:  Recent Labs Lab 06/07/15 1909 06/08/15 1430 06/08/15 2150  WBC 13.9* 10.6* 8.0  NEUTROABS 11.4* 8.7* 6.0  HGB 9.8* 9.3* 8.9*  HCT 31.9* 30.1* 28.6*  MCV 91.4 90.9 88.5  PLT 174 162 153   Cardiac Enzymes: No results for input(s): CKTOTAL, CKMB, CKMBINDEX, TROPONINI in the last 168 hours. BNP (last 3 results) No results for input(s): BNP in the last 8760 hours.  ProBNP (last 3 results) No results for input(s): PROBNP in the last 8760 hours.  CBG:  Recent Labs Lab 06/08/15 2240 06/09/15 0803  GLUCAP 122* 113*    No results found for this or any previous visit (from the past 240 hour(s)).   Studies: Dg Chest Port 1 View  06/08/2015   CLINICAL DATA:  Shortness of breath.  Hypoxia.  Symptoms for 1 day.  EXAM: PORTABLE CHEST - 1 VIEW  COMPARISON:  None.  FINDINGS: The heart is at the upper limits of normal in size. There is atherosclerosis of the thoracic aorta. There is central bronchial thickening and increased central bronchogenic markings. Ill-defined linear density in the right midlung zone in the region of the minor fissure, may reflect small amount pleural  fluid versus scarring. Minimal blunting of left costophrenic angle and minimal increased density in the periphery of the left lower lung zone. No confluent airspace disease. No pneumothorax. No pulmonary edema. There is severe degenerative change about the right shoulder.  IMPRESSION: 1. Ill-defined linear density in the right midlung zone, may reflect scarring or small amount of fluid in the minor fissure. 2. Bronchial thickening and increased central bronchogenic markings, this may be chronic, however no prior exams are available for comparison. Bronchitis could have a similar appearance.   Electronically Signed    By: Rubye Oaks M.D.   On: 06/08/2015 17:03   Ct Maxillofacial Wo Cm  06/09/2015   CLINICAL DATA:  Left-sided cheek and peri-orbital swelling and pain. Facial cellulitis with induration of skin.  EXAM: CT MAXILLOFACIAL WITHOUT CONTRAST  TECHNIQUE: Multidetector CT imaging of the maxillofacial structures was performed. Multiplanar CT image reconstructions were also generated. A small metallic BB was placed on the right temple in order to reliably differentiate right from left.  COMPARISON:  None.  FINDINGS: Left facial skin thickening and soft tissue edema. Scattered small ill-defined soft tissue densities in the region of soft tissue edema measuring up to 11 mm in the subcutaneous tissues. There is no fluid collection or abscess. No soft tissue air. There are no bony destructive changes. Patient is edentulous of the upper teeth, no periapical lucencies about the in situ lower teeth. Both orbits and globes are intact. There is no facial bone fracture. Right carotid stent and surgical clips adjacent to the left carotid artery noted. Paranasal sinuses and mastoid air cells are well aerated. Incidental note of degenerative change in the included cervical spine with degenerative-type anterolisthesis of C3 on C4 and C4 on C5.  IMPRESSION: Skin thickening and soft tissue edema about the left face, consistent with stated history of cellulitis. There are small rounded soft tissue densities in the region of edema, may reflect lymph nodes, phlegmonous change versus less likely small hematomas. There is no abscess or soft tissue air.   Electronically Signed   By: Rubye Oaks M.D.   On: 06/09/2015 00:47    Scheduled Meds: . clopidogrel  75 mg Oral Daily  . heparin  5,000 Units Subcutaneous 3 times per day  . insulin aspart  0-5 Units Subcutaneous QHS  . insulin aspart  0-9 Units Subcutaneous TID WC  . piperacillin-tazobactam (ZOSYN)  IV  3.375 g Intravenous Q8H  . sodium chloride  3 mL Intravenous Q12H   . [START ON 06/10/2015] vancomycin  1,000 mg Intravenous Q48H   Continuous Infusions: . sodium chloride 75 mL/hr at 06/08/15 2155    Principal Problem:   Cellulitis, face Active Problems:   Essential hypertension   Diabetes mellitus type 2 in nonobese   Dyslipidemia    Time spent: 35 minutes.     Hartley Barefoot A  [facility] Pager [phone]. If 7PM-7AM, please contact night-coverage at [email or web address], password [facility] 06/09/2015, 10:52 AM  LOS: 1 day

## 2015-06-09 NOTE — Consult Note (Signed)
Reason for Consult:left face cellulitis Referring Physician: hoispitalist  Victoria Luna is an 74 y.o. female.  HPI: hx of left mouth skin lesion that was tender. She had left facial swelling about 3 days ago and treated with Keflex. She used this for 1-2 days. She progressed with more pain and swelling. She is admitted for iv abx. CT scan did not show obvious abscess. No previous episodes.   Past Medical History  Diagnosis Date  . Hypertension   . High cholesterol   . Vascular disease   . Diabetes mellitus without complication     Past Surgical History  Procedure Laterality Date  . Vascular surgery      History reviewed. No pertinent family history.  Social History:  reports that she has never smoked. She does not have any smokeless tobacco history on file. She reports that she does not drink alcohol or use illicit drugs.  Allergies:  Allergies  Allergen Reactions  . Codeine   . Levaquin [Levofloxacin In D5w]     hallucinations    Medications: I have reviewed the patient's current medications.  Results for orders placed or performed during the hospital encounter of 06/08/15 (from the past 48 hour(s))  CBC with Differential     Status: Abnormal   Collection Time: 06/08/15  2:30 PM  Result Value Ref Range   WBC 10.6 (H) 4.0 - 10.5 K/uL   RBC 3.31 (L) 3.87 - 5.11 MIL/uL   Hemoglobin 9.3 (L) 12.0 - 15.0 g/dL   HCT 30.1 (L) 36.0 - 46.0 %   MCV 90.9 78.0 - 100.0 fL   MCH 28.1 26.0 - 34.0 pg   MCHC 30.9 30.0 - 36.0 g/dL   RDW 15.6 (H) 11.5 - 15.5 %   Platelets 162 150 - 400 K/uL   Neutrophils Relative % 83 (H) 43 - 77 %   Neutro Abs 8.7 (H) 1.7 - 7.7 K/uL   Lymphocytes Relative 9 (L) 12 - 46 %   Lymphs Abs 1.0 0.7 - 4.0 K/uL   Monocytes Relative 8 3 - 12 %   Monocytes Absolute 0.9 0.1 - 1.0 K/uL   Eosinophils Relative 0 0 - 5 %   Eosinophils Absolute 0.0 0.0 - 0.7 K/uL   Basophils Relative 0 0 - 1 %   Basophils Absolute 0.0 0.0 - 0.1 K/uL  Basic metabolic panel      Status: Abnormal   Collection Time: 06/08/15  2:30 PM  Result Value Ref Range   Sodium 138 135 - 145 mmol/L   Potassium 3.3 (L) 3.5 - 5.1 mmol/L   Chloride 103 101 - 111 mmol/L   CO2 25 22 - 32 mmol/L   Glucose, Bld 138 (H) 65 - 99 mg/dL   BUN 27 (H) 6 - 20 mg/dL   Creatinine, Ser 1.31 (H) 0.44 - 1.00 mg/dL   Calcium 8.0 (L) 8.9 - 10.3 mg/dL   GFR calc non Af Amer 39 (L) >60 mL/min   GFR calc Af Amer 45 (L) >60 mL/min    Comment: (NOTE) The eGFR has been calculated using the CKD EPI equation. This calculation has not been validated in all clinical situations. eGFR's persistently <60 mL/min signify possible Chronic Kidney Disease.    Anion gap 10 5 - 15  I-Stat CG4 Lactic Acid, ED     Status: None   Collection Time: 06/08/15  3:12 PM  Result Value Ref Range   Lactic Acid, Venous 0.69 0.5 - 2.0 mmol/L  CBC with Differential  Status: Abnormal   Collection Time: 06/08/15  9:50 PM  Result Value Ref Range   WBC 8.0 4.0 - 10.5 K/uL   RBC 3.23 (L) 3.87 - 5.11 MIL/uL   Hemoglobin 8.9 (L) 12.0 - 15.0 g/dL   HCT 28.6 (L) 36.0 - 46.0 %   MCV 88.5 78.0 - 100.0 fL   MCH 27.6 26.0 - 34.0 pg   MCHC 31.1 30.0 - 36.0 g/dL   RDW 15.7 (H) 11.5 - 15.5 %   Platelets 153 150 - 400 K/uL   Neutrophils Relative % 75 43 - 77 %   Neutro Abs 6.0 1.7 - 7.7 K/uL   Lymphocytes Relative 16 12 - 46 %   Lymphs Abs 1.3 0.7 - 4.0 K/uL   Monocytes Relative 8 3 - 12 %   Monocytes Absolute 0.6 0.1 - 1.0 K/uL   Eosinophils Relative 1 0 - 5 %   Eosinophils Absolute 0.1 0.0 - 0.7 K/uL   Basophils Relative 0 0 - 1 %   Basophils Absolute 0.0 0.0 - 0.1 K/uL  Comprehensive metabolic panel     Status: Abnormal   Collection Time: 06/08/15  9:50 PM  Result Value Ref Range   Sodium 139 135 - 145 mmol/L   Potassium 3.9 3.5 - 5.1 mmol/L   Chloride 104 101 - 111 mmol/L   CO2 28 22 - 32 mmol/L   Glucose, Bld 162 (H) 65 - 99 mg/dL   BUN 24 (H) 6 - 20 mg/dL   Creatinine, Ser 1.40 (H) 0.44 - 1.00 mg/dL   Calcium  7.9 (L) 8.9 - 10.3 mg/dL   Total Protein 5.8 (L) 6.5 - 8.1 g/dL   Albumin 2.8 (L) 3.5 - 5.0 g/dL   AST 16 15 - 41 U/L   ALT 10 (L) 14 - 54 U/L   Alkaline Phosphatase 67 38 - 126 U/L   Total Bilirubin 0.4 0.3 - 1.2 mg/dL   GFR calc non Af Amer 36 (L) >60 mL/min   GFR calc Af Amer 41 (L) >60 mL/min    Comment: (NOTE) The eGFR has been calculated using the CKD EPI equation. This calculation has not been validated in all clinical situations. eGFR's persistently <60 mL/min signify possible Chronic Kidney Disease.    Anion gap 7 5 - 15  Lactic acid, plasma     Status: None   Collection Time: 06/08/15  9:50 PM  Result Value Ref Range   Lactic Acid, Venous 0.9 0.5 - 2.0 mmol/L  Procalcitonin     Status: None   Collection Time: 06/08/15  9:50 PM  Result Value Ref Range   Procalcitonin 0.38 ng/mL    Comment:        Interpretation: PCT (Procalcitonin) <= 0.5 ng/mL: Systemic infection (sepsis) is not likely. Local bacterial infection is possible. (NOTE)         ICU PCT Algorithm               Non ICU PCT Algorithm    ----------------------------     ------------------------------         PCT < 0.25 ng/mL                 PCT < 0.1 ng/mL     Stopping of antibiotics            Stopping of antibiotics       strongly encouraged.               strongly encouraged.    ----------------------------     ------------------------------  PCT level decrease by               PCT < 0.25 ng/mL       >= 80% from peak PCT       OR PCT 0.25 - 0.5 ng/mL          Stopping of antibiotics                                             encouraged.     Stopping of antibiotics           encouraged.    ----------------------------     ------------------------------       PCT level decrease by              PCT >= 0.25 ng/mL       < 80% from peak PCT        AND PCT >= 0.5 ng/mL            Continuin g antibiotics                                              encouraged.       Continuing antibiotics             encouraged.    ----------------------------     ------------------------------     PCT level increase compared          PCT > 0.5 ng/mL         with peak PCT AND          PCT >= 0.5 ng/mL             Escalation of antibiotics                                          strongly encouraged.      Escalation of antibiotics        strongly encouraged.   Protime-INR     Status: Abnormal   Collection Time: 06/08/15  9:50 PM  Result Value Ref Range   Prothrombin Time 15.3 (H) 11.6 - 15.2 seconds   INR 1.19 0.00 - 1.49  APTT     Status: None   Collection Time: 06/08/15  9:50 PM  Result Value Ref Range   aPTT 30 24 - 37 seconds  Glucose, capillary     Status: Abnormal   Collection Time: 06/08/15 10:40 PM  Result Value Ref Range   Glucose-Capillary 122 (H) 65 - 99 mg/dL   Comment 1 Notify RN    Comment 2 Document in Chart   Lactic acid, plasma     Status: None   Collection Time: 06/09/15 12:05 AM  Result Value Ref Range   Lactic Acid, Venous 0.5 0.5 - 2.0 mmol/L  Glucose, capillary     Status: Abnormal   Collection Time: 06/09/15  8:03 AM  Result Value Ref Range   Glucose-Capillary 113 (H) 65 - 99 mg/dL  Glucose, capillary     Status: Abnormal   Collection Time: 06/09/15 12:09 PM  Result Value Ref Range   Glucose-Capillary 117 (H) 65 - 99 mg/dL  Protime-INR  Status: None   Collection Time: 06/09/15 12:22 PM  Result Value Ref Range   Prothrombin Time 15.0 11.6 - 15.2 seconds   INR 1.17 0.00 - 1.49  APTT     Status: None   Collection Time: 06/09/15 12:22 PM  Result Value Ref Range   aPTT 30 24 - 37 seconds    Dg Chest Port 1 View  06/08/2015   CLINICAL DATA:  Shortness of breath.  Hypoxia.  Symptoms for 1 day.  EXAM: PORTABLE CHEST - 1 VIEW  COMPARISON:  None.  FINDINGS: The heart is at the upper limits of normal in size. There is atherosclerosis of the thoracic aorta. There is central bronchial thickening and increased central bronchogenic markings. Ill-defined linear density in  the right midlung zone in the region of the minor fissure, may reflect small amount pleural fluid versus scarring. Minimal blunting of left costophrenic angle and minimal increased density in the periphery of the left lower lung zone. No confluent airspace disease. No pneumothorax. No pulmonary edema. There is severe degenerative change about the right shoulder.  IMPRESSION: 1. Ill-defined linear density in the right midlung zone, may reflect scarring or small amount of fluid in the minor fissure. 2. Bronchial thickening and increased central bronchogenic markings, this may be chronic, however no prior exams are available for comparison. Bronchitis could have a similar appearance.   Electronically Signed   By: Jeb Levering M.D.   On: 06/08/2015 17:03   Ct Maxillofacial Wo Cm  06/09/2015   CLINICAL DATA:  Left-sided cheek and peri-orbital swelling and pain. Facial cellulitis with induration of skin.  EXAM: CT MAXILLOFACIAL WITHOUT CONTRAST  TECHNIQUE: Multidetector CT imaging of the maxillofacial structures was performed. Multiplanar CT image reconstructions were also generated. A small metallic BB was placed on the right temple in order to reliably differentiate right from left.  COMPARISON:  None.  FINDINGS: Left facial skin thickening and soft tissue edema. Scattered small ill-defined soft tissue densities in the region of soft tissue edema measuring up to 11 mm in the subcutaneous tissues. There is no fluid collection or abscess. No soft tissue air. There are no bony destructive changes. Patient is edentulous of the upper teeth, no periapical lucencies about the in situ lower teeth. Both orbits and globes are intact. There is no facial bone fracture. Right carotid stent and surgical clips adjacent to the left carotid artery noted. Paranasal sinuses and mastoid air cells are well aerated. Incidental note of degenerative change in the included cervical spine with degenerative-type anterolisthesis of C3 on C4  and C4 on C5.  IMPRESSION: Skin thickening and soft tissue edema about the left face, consistent with stated history of cellulitis. There are small rounded soft tissue densities in the region of edema, may reflect lymph nodes, phlegmonous change versus less likely small hematomas. There is no abscess or soft tissue air.   Electronically Signed   By: Jeb Levering M.D.   On: 06/09/2015 00:47    ROS Blood pressure 113/39, pulse 60, temperature 98 F (36.7 C), temperature source Oral, resp. rate 18, height 4' 1.32" (1.253 m), weight 58.378 kg (128 lb 11.2 oz), SpO2 92 %. Physical Exam  Constitutional: She appears well-developed and well-nourished.  HENT:  There is no nasal issues. There is a skin lesion oin the left lateral mouth at the commissure that is slightly erythematous and ecchymosis. The induration of the left cheek is about 4 cm but confined to the area. Not much cellulitis of the skin.  The eye is uninvolved. Tender to palpation. No upper teeth or lesions in the mouth  Eyes: Conjunctivae are normal. Pupils are equal, round, and reactive to light.  Neck: Normal range of motion. Neck supple.    Assessment/Plan: Left facial cellulitis- most likely this is MRSA and abx for such is the treatment of choice for now. If it does not respond in few days than there is a consideration for I/D but not now especially since there is no fluid collection on CT scan.   Victoria Luna 06/09/2015, 1:13 PM

## 2015-06-09 NOTE — H&P (Signed)
[facility] History and Physical  Patient: Victoria Luna  MRN: [id number]  DOB: 09-18-1941  DOS: the patient was seen and examined on 06/08/2015 PCP: Malka So., MD  Referring physician: [facility] [place] Chief Complaint: Facial infection  HPI: Victoria Luna is a 74 y.o. female with Past medical history of hypertension, diabetes mellitus, peripheral vascular disease. The patient is presenting with complaints of redness and swelling of her face. This has been ongoing since last one week. She initially noted an small papule and later on started having complaints of worsening swelling. She was in she the ER given vancomycin and was discharged on oral Keflex. Despite taking it for 2 days her swelling and redness of progressively worsened and therefore she decided to come back to the hospital. She denies any complaints of chest pain, nausea, vomiting, abdominal pain, diarrhea, constipation. She denies any rash anywhere else. He denies any recent ear infection recent dental surgery or eye surgery.  The patient is coming from home.  At her baseline ambulates without any support And is independent for most of her ADL manages her medication on her own.  Review of Systems: as mentioned in the history of present illness.  A comprehensive review of the other systems is negative.  Past Medical History  Diagnosis Date  . Hypertension   . High cholesterol   . Vascular disease   . Diabetes mellitus without complication    Past Surgical History  Procedure Laterality Date  . Vascular surgery     Social History:  reports that she has never smoked. She does not have any smokeless tobacco history on file. She reports that she does not drink alcohol or use illicit drugs.  Allergies  Allergen Reactions  . Codeine   . Levaquin [Levofloxacin In D5w]     hallucinations    History reviewed. No pertinent family history.  Prior to Admission medications   Medication Sig Start Date End  Date Taking? Authorizing Provider  cephALEXin (KEFLEX) 500 MG capsule Take 1 capsule (500 mg total) by mouth 4 (four) times daily. 06/07/15   Vanetta Mulders, MD  clopidogrel (PLAVIX) 75 MG tablet Take 75 mg by mouth daily.    Historical Provider, MD  doxycycline (VIBRAMYCIN) 100 MG capsule Take 1 capsule (100 mg total) by mouth 2 (two) times daily. 06/07/15   Vanetta Mulders, MD  metoprolol tartrate (LOPRESSOR) 25 MG tablet Take 25 mg by mouth 2 (two) times daily.    Historical Provider, MD    Physical Exam: Filed Vitals:   06/08/15 2029 06/08/15 2242 06/09/15 0538 06/09/15 0548  BP: 127/46 121/38 111/40   Pulse: 64 60 63   Temp: 98.5 F (36.9 C)  99.3 F (37.4 C)   TempSrc: Oral  Oral   Resp: 16  15   Height:      Weight:    58.378 kg (128 lb 11.2 oz)  SpO2: 100%  96%     General: Alert, Awake and Oriented to Time, Place and Person. Appear in mild distress Eyes: PERRL ENT: Oral Mucosa clear moist. left-sided facial swelling with induration as well as tenderness Neck: no JVD Cardiovascular: S1 and S2 Present, no Murmur, Peripheral Pulses Present Respiratory: Bilateral Air entry equal and Decreased,  Clear to Auscultation, no Crackles, no wheezes Abdomen: Bowel Sound present, Soft and non tender Skin: no Rash Extremities: no Pedal edema, no calf tenderness Neurologic: Grossly no focal neuro deficit.  Labs on Admission:  CBC:  Recent Labs Lab 06/07/15 1909 06/08/15 1430  06/08/15 2150  WBC 13.9* 10.6* 8.0  NEUTROABS 11.4* 8.7* 6.0  HGB 9.8* 9.3* 8.9*  HCT 31.9* 30.1* 28.6*  MCV 91.4 90.9 88.5  PLT 174 162 153    CMP     Component Value Date/Time   NA 139 06/08/2015 2150   K 3.9 06/08/2015 2150   CL 104 06/08/2015 2150   CO2 28 06/08/2015 2150   GLUCOSE 162* 06/08/2015 2150   BUN 24* 06/08/2015 2150   CREATININE 1.40* 06/08/2015 2150   CALCIUM 7.9* 06/08/2015 2150   PROT 5.8* 06/08/2015 2150   ALBUMIN 2.8* 06/08/2015 2150   AST 16 06/08/2015 2150   ALT 10*  06/08/2015 2150   ALKPHOS 67 06/08/2015 2150   BILITOT 0.4 06/08/2015 2150   GFRNONAA 36* 06/08/2015 2150   GFRAA 41* 06/08/2015 2150    No results for input(s): LIPASE, AMYLASE in the last 168 hours.  No results for input(s): CKTOTAL, CKMB, CKMBINDEX, TROPONINI in the last 168 hours. BNP (last 3 results) No results for input(s): BNP in the last 8760 hours.  ProBNP (last 3 results) No results for input(s): PROBNP in the last 8760 hours.   Radiological Exams on Admission: Dg Chest Port 1 View  06/08/2015   CLINICAL DATA:  Shortness of breath.  Hypoxia.  Symptoms for 1 day.  EXAM: PORTABLE CHEST - 1 VIEW  COMPARISON:  None.  FINDINGS: The heart is at the upper limits of normal in size. There is atherosclerosis of the thoracic aorta. There is central bronchial thickening and increased central bronchogenic markings. Ill-defined linear density in the right midlung zone in the region of the minor fissure, may reflect small amount pleural fluid versus scarring. Minimal blunting of left costophrenic angle and minimal increased density in the periphery of the left lower lung zone. No confluent airspace disease. No pneumothorax. No pulmonary edema. There is severe degenerative change about the right shoulder.  IMPRESSION: 1. Ill-defined linear density in the right midlung zone, may reflect scarring or small amount of fluid in the minor fissure. 2. Bronchial thickening and increased central bronchogenic markings, this may be chronic, however no prior exams are available for comparison. Bronchitis could have a similar appearance.   Electronically Signed   By: Rubye Oaks M.D.   On: 06/08/2015 17:03   Ct Maxillofacial Wo Cm  06/09/2015   CLINICAL DATA:  Left-sided cheek and peri-orbital swelling and pain. Facial cellulitis with induration of skin.  EXAM: CT MAXILLOFACIAL WITHOUT CONTRAST  TECHNIQUE: Multidetector CT imaging of the maxillofacial structures was performed. Multiplanar CT image  reconstructions were also generated. A small metallic BB was placed on the right temple in order to reliably differentiate right from left.  COMPARISON:  None.  FINDINGS: Left facial skin thickening and soft tissue edema. Scattered small ill-defined soft tissue densities in the region of soft tissue edema measuring up to 11 mm in the subcutaneous tissues. There is no fluid collection or abscess. No soft tissue air. There are no bony destructive changes. Patient is edentulous of the upper teeth, no periapical lucencies about the in situ lower teeth. Both orbits and globes are intact. There is no facial bone fracture. Right carotid stent and surgical clips adjacent to the left carotid artery noted. Paranasal sinuses and mastoid air cells are well aerated. Incidental note of degenerative change in the included cervical spine with degenerative-type anterolisthesis of C3 on C4 and C4 on C5.  IMPRESSION: Skin thickening and soft tissue edema about the left face, consistent with stated history  of cellulitis. There are small rounded soft tissue densities in the region of edema, may reflect lymph nodes, phlegmonous change versus less likely small hematomas. There is no abscess or soft tissue air.   Electronically Signed   By: Rubye Oaks M.D.   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 Cellulitis, face The patient is presenting with numbness of cellulitis of the face which is not responding to oral antibiotics. She has history of diabetes significant induration of the face, will get ct maxilla facial to rule out parotid gland disease. Vancomycin and Zosyn  Checking sepsis workup.  2. Hypertension. The patient is not aware of what medications she is taking and her family member is not available. The patient does not follow-up with our system. Blood pressure stable  continue to closely monitor.  3. History of peripheral vascular disease. Continuing Plavix as home medication.  4. Diabetes mellitus. The patient is not aware of what medication she is taking but will place on sliding scale.  Advance goals of care discussion: DNR/DNI as per my discussion with patient  Her sister will be her power of attorney.  DVT Prophylaxis: subcutaneous Heparin Nutrition: Cardiac and diabetic diet  Disposition: Admitted asinpatient, telemetry unit.  Author: Lynden Oxford, MD [facility] Pager: [phone]   If 7PM-7AM, please contact night-coverage [email or web address] Password [email or web address]

## 2015-06-10 DIAGNOSIS — A419 Sepsis, unspecified organism: Secondary | ICD-10-CM

## 2015-06-10 LAB — BASIC METABOLIC PANEL
Anion gap: 5 (ref 5–15)
BUN: 16 mg/dL (ref 6–20)
CO2: 23 mmol/L (ref 22–32)
CREATININE: 1.22 mg/dL — AB (ref 0.44–1.00)
Calcium: 7 mg/dL — ABNORMAL LOW (ref 8.9–10.3)
Chloride: 112 mmol/L — ABNORMAL HIGH (ref 101–111)
GFR calc Af Amer: 49 mL/min — ABNORMAL LOW (ref >60)
GFR calc non Af Amer: 42 mL/min — ABNORMAL LOW (ref >60)
Glucose, Bld: 103 mg/dL — ABNORMAL HIGH (ref 65–99)
Potassium: 3.7 mmol/L (ref 3.5–5.1)
Sodium: 140 mmol/L (ref 135–145)

## 2015-06-10 LAB — GLUCOSE, CAPILLARY
GLUCOSE-CAPILLARY: 101 mg/dL — AB (ref 65–99)
GLUCOSE-CAPILLARY: 132 mg/dL — AB (ref 65–99)
GLUCOSE-CAPILLARY: 123 mg/dL — AB (ref 65–99)
Glucose-Capillary: 95 mg/dL (ref 65–99)

## 2015-06-10 LAB — MRSA PCR SCREENING: MRSA BY PCR: POSITIVE — AB

## 2015-06-10 MED ORDER — LORAZEPAM 0.5 MG PO TABS
0.2500 mg | ORAL_TABLET | Freq: Once | ORAL | Status: AC | PRN
  Administered 2015-06-10: 0.25 mg via ORAL
  Filled 2015-06-10: qty 1

## 2015-06-10 MED ORDER — CHLORHEXIDINE GLUCONATE CLOTH 2 % EX PADS
6.0000 | MEDICATED_PAD | Freq: Every day | CUTANEOUS | Status: DC
  Administered 2015-06-11 – 2015-06-13 (×3): 6 via TOPICAL

## 2015-06-10 MED ORDER — MUPIROCIN 2 % EX OINT
1.0000 "application " | TOPICAL_OINTMENT | Freq: Two times a day (BID) | CUTANEOUS | Status: DC
  Administered 2015-06-10 – 2015-06-13 (×7): 1 via NASAL
  Filled 2015-06-10: qty 22

## 2015-06-10 MED ORDER — METOPROLOL TARTRATE 25 MG PO TABS
25.0000 mg | ORAL_TABLET | Freq: Two times a day (BID) | ORAL | Status: DC
  Administered 2015-06-10 – 2015-06-13 (×5): 25 mg via ORAL
  Filled 2015-06-10 (×8): qty 1

## 2015-06-10 NOTE — Evaluation (Signed)
Physical Therapy Evaluation Patient Details Name: Victoria Luna MRN: [id number] DOB: 03-26-1941 Today's Date: 06/10/2015   History of Present Illness  74 y.o. female with past medical history significant for hypertension, high cholesterol, non-insulin-dependent diabetes complaining of worsening cellulitis to left cheek. Patient was seen for similar yesterday, she was given a dose of vancomycin IV and sent home on Keflex and doxycycline. Patient states that she is taken 3 doses or oral meds and feels that the cellulitis is spreading and getting closer to the eye.  Clinical Impression  Pt presents with overall decreased mobility and decreased balance.  Tolerated gait in hallway >200' with min A HHA to simulate gait with SPC.  Continued acute services to address deficits listed below.  Recommend HHPT for follow up to increase independence and decreased fall risk.     Follow Up Recommendations Home health PT;Supervision - Intermittent    Equipment Recommendations  None recommended by PT    Recommendations for Other Services       Precautions / Restrictions Precautions Precautions: Fall Precaution Comments: L face cellulitis Restrictions Weight Bearing Restrictions: No      Mobility  Bed Mobility Overal bed mobility: Modified Independent             General bed mobility comments: Pt requires increased time but was able to perform with HOB flat and without rails to better simulate home.   Transfers Overall transfer level: Modified independent Equipment used: None                Ambulation/Gait Ambulation/Gait assistance: Min assist (HHA) Ambulation Distance (Feet): 300 Feet Assistive device: 1 person hand held assist Gait Pattern/deviations: Decreased stride length;Trunk flexed Gait velocity: decreased   General Gait Details: Pt overall very steady during gait.  Pt uses SPC at home, therefore provided HHA to simulate SPC.  No overt LOB and no heavy pressure through  therapist during gait.   Stairs            Wheelchair Mobility    Modified Rankin (Stroke Patients Only)       Balance Overall balance assessment: Needs assistance Sitting-balance support: Feet supported Sitting balance-Leahy Scale: Good     Standing balance support: During functional activity;Bilateral upper extremity supported Standing balance-Leahy Scale: Fair Standing balance comment: Pt able to stand in restroom and adjust brief with toileting without UE support at S level.                             Pertinent Vitals/Pain Pain Assessment: 0-10 Pain Score: 5  Pain Location: L face Pain Descriptors / Indicators: Aching Pain Intervention(s): Monitored during session    Home Living Family/patient expects to be discharged to:: Private residence Living Arrangements: Children Available Help at Discharge: Family;Available 24 hours/day Type of Home: Mobile home Home Access: Ramped entrance     Home Layout: One level Home Equipment: Cane - single point Additional Comments: son lives with pt but she was driving doing her own shopping and home making    Prior Function Level of Independence: Independent with assistive device(s)               Hand Dominance        Extremity/Trunk Assessment               Lower Extremity Assessment: Overall WFL for tasks assessed      Cervical / Trunk Assessment: Kyphotic  Communication   Communication: No difficulties  Cognition  Arousal/Alertness: Awake/alert Behavior During Therapy: WFL for tasks assessed/performed Overall Cognitive Status: Within Functional Limits for tasks assessed                      General Comments      Exercises        Assessment/Plan    PT Assessment Patient needs continued PT services  PT Diagnosis Difficulty walking;Generalized weakness;Acute pain   PT Problem List Decreased balance;Decreased mobility  PT Treatment Interventions DME instruction;Gait  training;Stair training;Functional mobility training;Therapeutic activities;Therapeutic exercise;Balance training;Patient/family education   PT Goals (Current goals can be found in the Care Plan section) Acute Rehab PT Goals Patient Stated Goal: to get better PT Goal Formulation: With patient Time For Goal Achievement: 06/17/15 Potential to Achieve Goals: Good    Frequency Min 3X/week   Barriers to discharge        Co-evaluation               End of Session Equipment Utilized During Treatment: Gait belt Activity Tolerance: Patient tolerated treatment well Patient left: in bed;with call bell/phone within reach Nurse Communication: Mobility status         Time: [id number] PT Time Calculation (min) (ACUTE ONLY): 23 min   Charges:   PT Evaluation $Initial PT Evaluation Tier I: 1 Procedure PT Treatments $Gait Training: 8-22 mins   PT G Codes:        Vista Deckarcell, Keon Benscoter Ann 06/10/2015, 4:14 PM

## 2015-06-10 NOTE — Progress Notes (Signed)
Dr. Sunnie Nielsenegalado notified of patient's bleeding from heparin injections this morning prior to 0700. Dressing applied. Verbal order to hold heparin today.

## 2015-06-10 NOTE — Care Management (Signed)
Important Message  Patient Details  Name: Julio SicksBetty Kriesel MRN: [id number] Date of Birth: 08/05/1941   Medicare Important Message Given:  Yes-second notification given    Kyla BalzarineShealy, Tanijah Morais Abena 06/10/2015, 1:33 PM

## 2015-06-10 NOTE — Progress Notes (Signed)
[facility] PROGRESS NOTE  Victoria SicksBetty Luna UEA:[id number] DOB: 04/03/1941 DOA: 06/08/2015 PCP: Malka SoJOBE,DANIEL B., MD  Assessment/Plan: 1-sepsis;  Low blood pressure, febrile leukocytosis. Facial cellulitis.  BP improved.  Repeated  lactic acid at 0.3 Hold metoprolol.  Continue with IV fluids and IV antibiotics.   Facial cellulitis vs abscess;  Continue with vancomycin and zosyn.  CT with possible phlegmon. Dr Jearld FentonByers consulted. Recommending to continue with IV antibiotics for now  3-HTN; resume metoprolol.   4-History of peripheral vascular disease. Continuing Plavix as home medication.  5. Diabetes mellitus. The patient is not aware of what medication she is taking but will place on sliding scale.  Code Status: DNR Family Communication: care discussed with patient.  Disposition Plan: Remain inpatient.    Consultants:  ENT  Procedures:  none  Antibiotics:  Vancomycin and zosyn  HPI/Subjective: Feeling better. Swelling the same today   Objective: Filed Vitals:   06/10/15 1401  BP: 160/50  Pulse: 71  Temp: 97.9 F (36.6 C)  Resp: 18    Intake/Output Summary (Last 24 hours) at 06/10/15 1552 Last data filed at 06/10/15 1358  Gross per 24 hour  Intake   2850 ml  Output   2600 ml  Net    250 ml   Filed Weights   06/08/15 2026 06/09/15 0548 06/10/15 0526  Weight: 56.246 kg (124 lb) 58.378 kg (128 lb 11.2 oz) 59.149 kg (130 lb 6.4 oz)    Exam:   General:  Alert in no distress.   Cardiovascular: S 1, S 2 RRR  Respiratory: crackles bases.  Abdomen: Bs present, soft, nt  Musculoskeletal: no edema  Skin, face: swelling left side face, redness. Induration 2 to 3 cm  Data Reviewed: Basic Metabolic Panel:  Recent Labs Lab 06/07/15 1909 06/08/15 1430 06/08/15 2150 06/10/15 0705  NA 138 138 139 140  K 3.7 3.3* 3.9 3.7  CL 101 103 104 112*  CO2 26 25 28 23   GLUCOSE 140* 138* 162* 103*  BUN 26* 27* 24* 16  CREATININE 1.36* 1.31* 1.40* 1.22*   CALCIUM 8.3* 8.0* 7.9* 7.0*   Liver Function Tests:  Recent Labs Lab 06/08/15 2150  AST 16  ALT 10*  ALKPHOS 67  BILITOT 0.4  PROT 5.8*  ALBUMIN 2.8*   No results for input(s): LIPASE, AMYLASE in the last 168 hours. No results for input(s): AMMONIA in the last 168 hours. CBC:  Recent Labs Lab 06/07/15 1909 06/08/15 1430 06/08/15 2150  WBC 13.9* 10.6* 8.0  NEUTROABS 11.4* 8.7* 6.0  HGB 9.8* 9.3* 8.9*  HCT 31.9* 30.1* 28.6*  MCV 91.4 90.9 88.5  PLT 174 162 153   Cardiac Enzymes: No results for input(s): CKTOTAL, CKMB, CKMBINDEX, TROPONINI in the last 168 hours. BNP (last 3 results) No results for input(s): BNP in the last 8760 hours.  ProBNP (last 3 results) No results for input(s): PROBNP in the last 8760 hours.  CBG:  Recent Labs Lab 06/09/15 1209 06/09/15 1745 06/09/15 2147 06/10/15 0757 06/10/15 1220  GLUCAP 117* 94 102* 101* 132*    Recent Results (from the past 240 hour(s))  Blood culture (routine x 2)     Status: None (Preliminary result)   Collection Time: 06/08/15  2:50 PM  Result Value Ref Range Status   Specimen Description BLOOD RIGHT ANTECUBITAL  Final   Special Requests BOTTLES DRAWN AEROBIC AND ANAEROBIC 10CC EA  Final   Culture   Final    NO GROWTH 2 DAYS [facility]  Report Status PENDING  Incomplete  Blood culture (routine x 2)     Status: None (Preliminary result)   Collection Time: 06/08/15  3:00 PM  Result Value Ref Range Status   Specimen Description BLOOD LEFT ANTECUBITAL  Final   Special Requests BOTTLES DRAWN AEROBIC AND ANAEROBIC 5CC EA  Final   Culture   Final    NO GROWTH 2 DAYS Performed at [facility]    Report Status PENDING  Incomplete  MRSA PCR Screening     Status: Abnormal   Collection Time: 06/10/15  8:56 AM  Result Value Ref Range Status   MRSA by PCR POSITIVE (A) NEGATIVE Final    Comment:        The GeneXpert MRSA Assay (FDA approved for NASAL specimens only), is one  component of a comprehensive MRSA colonization surveillance program. It is not intended to diagnose MRSA infection nor to guide or monitor treatment for MRSA infections.      Studies: Dg Chest Port 1 View  06/08/2015   CLINICAL DATA:  Shortness of breath.  Hypoxia.  Symptoms for 1 day.  EXAM: PORTABLE CHEST - 1 VIEW  COMPARISON:  None.  FINDINGS: The heart is at the upper limits of normal in size. There is atherosclerosis of the thoracic aorta. There is central bronchial thickening and increased central bronchogenic markings. Ill-defined linear density in the right midlung zone in the region of the minor fissure, may reflect small amount pleural fluid versus scarring. Minimal blunting of left costophrenic angle and minimal increased density in the periphery of the left lower lung zone. No confluent airspace disease. No pneumothorax. No pulmonary edema. There is severe degenerative change about the right shoulder.  IMPRESSION: 1. Ill-defined linear density in the right midlung zone, may reflect scarring or small amount of fluid in the minor fissure. 2. Bronchial thickening and increased central bronchogenic markings, this may be chronic, however no prior exams are available for comparison. Bronchitis could have a similar appearance.   Electronically Signed   By: Rubye Oaks M.D.   On: 06/08/2015 17:03   Ct Maxillofacial Wo Cm  06/09/2015   CLINICAL DATA:  Left-sided cheek and peri-orbital swelling and pain. Facial cellulitis with induration of skin.  EXAM: CT MAXILLOFACIAL WITHOUT CONTRAST  TECHNIQUE: Multidetector CT imaging of the maxillofacial structures was performed. Multiplanar CT image reconstructions were also generated. A small metallic BB was placed on the right temple in order to reliably differentiate right from left.  COMPARISON:  None.  FINDINGS: Left facial skin thickening and soft tissue edema. Scattered small ill-defined soft tissue densities in the region of soft tissue edema  measuring up to 11 mm in the subcutaneous tissues. There is no fluid collection or abscess. No soft tissue air. There are no bony destructive changes. Patient is edentulous of the upper teeth, no periapical lucencies about the in situ lower teeth. Both orbits and globes are intact. There is no facial bone fracture. Right carotid stent and surgical clips adjacent to the left carotid artery noted. Paranasal sinuses and mastoid air cells are well aerated. Incidental note of degenerative change in the included cervical spine with degenerative-type anterolisthesis of C3 on C4 and C4 on C5.  IMPRESSION: Skin thickening and soft tissue edema about the left face, consistent with stated history of cellulitis. There are small rounded soft tissue densities in the region of edema, may reflect lymph nodes, phlegmonous change versus less likely small hematomas. There is no abscess or soft tissue air.  Electronically Signed   By: Rubye Oaks M.D.   On: 06/09/2015 00:47    Scheduled Meds: . [START ON 06/11/2015] Chlorhexidine Gluconate Cloth  6 each Topical Q0600  . clopidogrel  75 mg Oral Daily  . insulin aspart  0-5 Units Subcutaneous QHS  . insulin aspart  0-9 Units Subcutaneous TID WC  . mupirocin ointment  1 application Nasal BID  . piperacillin-tazobactam (ZOSYN)  IV  3.375 g Intravenous Q8H  . sodium chloride  3 mL Intravenous Q12H  . vancomycin  1,000 mg Intravenous Q48H   Continuous Infusions: . sodium chloride 50 mL/hr at 06/10/15 1356    Principal Problem:   Cellulitis, face Active Problems:   Essential hypertension   Diabetes mellitus type 2 in nonobese   Dyslipidemia    Time spent: 35 minutes.     Hartley Barefoot A  [facility] Pager [phone]. If 7PM-7AM, please contact night-coverage at [email or web address], password [facility] 06/10/2015, 3:52 PM  LOS: 2 days

## 2015-06-11 ENCOUNTER — Inpatient Hospital Stay (HOSPITAL_COMMUNITY): Payer: Medicare Other

## 2015-06-11 LAB — CBC
HEMATOCRIT: 27.3 % — AB (ref 36.0–46.0)
Hemoglobin: 8.2 g/dL — ABNORMAL LOW (ref 12.0–15.0)
MCH: 26.5 pg (ref 26.0–34.0)
MCHC: 30 g/dL (ref 30.0–36.0)
MCV: 88.1 fL (ref 78.0–100.0)
Platelets: 171 10*3/uL (ref 150–400)
RBC: 3.1 MIL/uL — ABNORMAL LOW (ref 3.87–5.11)
RDW: 15.4 % (ref 11.5–15.5)
WBC: 5.8 10*3/uL (ref 4.0–10.5)

## 2015-06-11 LAB — BASIC METABOLIC PANEL
Anion gap: 10 (ref 5–15)
BUN: 11 mg/dL (ref 6–20)
CALCIUM: 7.7 mg/dL — AB (ref 8.9–10.3)
CO2: 23 mmol/L (ref 22–32)
CREATININE: 1.11 mg/dL — AB (ref 0.44–1.00)
Chloride: 111 mmol/L (ref 101–111)
GFR calc Af Amer: 55 mL/min — ABNORMAL LOW (ref >60)
GFR, EST NON AFRICAN AMERICAN: 47 mL/min — AB (ref >60)
GLUCOSE: 95 mg/dL (ref 65–99)
Potassium: 4.5 mmol/L (ref 3.5–5.1)
Sodium: 144 mmol/L (ref 135–145)

## 2015-06-11 LAB — GLUCOSE, CAPILLARY
GLUCOSE-CAPILLARY: 155 mg/dL — AB (ref 65–99)
GLUCOSE-CAPILLARY: 90 mg/dL (ref 65–99)
Glucose-Capillary: 85 mg/dL (ref 65–99)
Glucose-Capillary: 81 mg/dL (ref 65–99)

## 2015-06-11 NOTE — Progress Notes (Signed)
ANTIBIOTIC CONSULT NOTE   Pharmacy Consult for vanc and zosyn Indication: cellulitis  Allergies  Allergen Reactions  . Codeine   . Hydromorphone Other (See Comments)  . Levaquin [Levofloxacin In D5w]     hallucinations  . Pravastatin     Stomach pains    Patient Measurements: Height: 4' 1.32" (125.3 cm) Weight: 130 lb 6.4 oz (59.149 kg) IBW/kg (Calculated) : 20.94   Vital Signs: Temp: 98.7 F (37.1 C) (06/29 0604) Temp Source: Oral (06/29 0604) BP: 120/97 mmHg (06/29 0606) Pulse Rate: 92 (06/29 0606) Intake/Output from previous day: 06/28 0701 - 06/29 0700 In: 2593.3 [P.O.:780; I.V.:1513.3; IV Piggyback:300] Out: 1900 [Urine:1900] Intake/Output from this shift: Total I/O In: 847.5 [P.O.:720; I.V.:127.5] Out: -   Labs:  Recent Labs  06/08/15 1430 06/08/15 2150 06/10/15 0705 06/11/15 0515  WBC 10.6* 8.0  --  5.8  HGB 9.3* 8.9*  --  8.2*  PLT 162 153  --  171  CREATININE 1.31* 1.40* 1.22* 1.11*   Estimated Creatinine Clearance: 25 mL/min (by C-G formula based on Cr of 1.11). No results for input(s): VANCOTROUGH, VANCOPEAK, VANCORANDOM, GENTTROUGH, GENTPEAK, GENTRANDOM, TOBRATROUGH, TOBRAPEAK, TOBRARND, AMIKACINPEAK, AMIKACINTROU, AMIKACIN in the last 72 hours.   Microbiology: Recent Results (from the past 720 hour(s))  Blood culture (routine x 2)     Status: None (Preliminary result)   Collection Time: 06/08/15  2:50 PM  Result Value Ref Range Status   Specimen Description BLOOD RIGHT ANTECUBITAL  Final   Special Requests BOTTLES DRAWN AEROBIC AND ANAEROBIC 10CC EA  Final   Culture   Final    NO GROWTH 2 DAYS Performed at [facility]    Report Status PENDING  Incomplete  Blood culture (routine x 2)     Status: None (Preliminary result)   Collection Time: 06/08/15  3:00 PM  Result Value Ref Range Status   Specimen Description BLOOD LEFT ANTECUBITAL  Final   Special Requests BOTTLES DRAWN AEROBIC AND ANAEROBIC 5CC EA  Final   Culture   Final   NO GROWTH 2 DAYS Performed at [facility]    Report Status PENDING  Incomplete  MRSA PCR Screening     Status: Abnormal   Collection Time: 06/10/15  8:56 AM  Result Value Ref Range Status   MRSA by PCR POSITIVE (A) NEGATIVE Final    Comment:        The GeneXpert MRSA Assay (FDA approved for NASAL specimens only), is one component of a comprehensive MRSA colonization surveillance program. It is not intended to diagnose MRSA infection nor to guide or monitor treatment for MRSA infections.     Medical History: Past Medical History  Diagnosis Date  . Hypertension   . High cholesterol   . Vascular disease   . Diabetes mellitus without complication     Medications:  Prescriptions prior to admission  Medication Sig Dispense Refill Last Dose  . amLODipine (NORVASC) 5 MG tablet Take 5 mg by mouth daily.   06/08/2015 at Unknown time  . aspirin 325 MG tablet Take 325 mg by mouth daily.   06/08/2015 at Unknown time  . atorvastatin (LIPITOR) 40 MG tablet Take 40 mg by mouth at bedtime.   06/07/2015  . cephALEXin (KEFLEX) 500 MG capsule Take 1 capsule (500 mg total) by mouth 4 (four) times daily. 28 capsule 0 06/08/2015  . Cholecalciferol (VITAMIN D3) 2000 UNITS capsule Take 2,000 Units by mouth daily.    06/08/2015 at Unknown time  . clopidogrel (PLAVIX) 75  MG tablet Take 75 mg by mouth daily.   06/08/2015 at Unknown time  . doxycycline (VIBRAMYCIN) 100 MG capsule Take 1 capsule (100 mg total) by mouth 2 (two) times daily. 14 capsule 0 06/08/2015 at Unknown time  . hydrALAZINE (APRESOLINE) 50 MG tablet Take 50 mg by mouth 3 (three) times daily.   06/08/2015 at Unknown time  . hydrochlorothiazide (HYDRODIURIL) 50 MG tablet Take 50 mg by mouth daily.   06/08/2015 at Unknown time  . loperamide (IMODIUM A-D) 2 MG tablet Take 4 mg by mouth daily as needed for diarrhea or loose stools.    1-2 weeks ago  . metoprolol tartrate (LOPRESSOR) 25 MG tablet Take 25 mg by mouth 2 (two) times daily.    06/08/2015 at 800  . pantoprazole (PROTONIX) 40 MG tablet Take 40 mg by mouth daily.   06/08/2015 at Unknown time  . pioglitazone (ACTOS) 15 MG tablet Take 15 mg by mouth daily.   06/08/2015 at Unknown time  . rOPINIRole (REQUIP) 0.25 MG tablet Take 0.25 mg by mouth at bedtime.   06/07/2015  . sertraline (ZOLOFT) 100 MG tablet Take 100 mg by mouth daily.   06/08/2015 at Unknown time  . Suvorexant (BELSOMRA) 5 MG TABS Take 5 mg by mouth at bedtime.   06/06/2015   Assessment: 74 yo lady on vancomycin and zosyn for cellulitis.    Goal of Therapy:  Vancomycin trough level 10-15 mcg/ml  Plan:  Cont Zosyn 3.375 gm IV q8 hours Cont Vancomycin 1gm IV q48 hours Monitor renal function, cultures and clinical course  Thanks for allowing pharmacy to be a part of this patient's care.  Talbert CageLora Helio Lack, PharmD Clinical Pharmacist, [phone]  06/11/2015,9:46 AM

## 2015-06-11 NOTE — Progress Notes (Signed)
Physical Therapy Treatment Patient Details Name: Victoria SicksBetty Luna MRN: [id number] DOB: 11/10/1941 Today's Date: 06/11/2015    History of Present Illness 74 y.o. female with past medical history significant for hypertension, high cholesterol, non-insulin-dependent diabetes complaining of worsening cellulitis to left cheek. Patient was seen for similar yesterday, she was given a dose of vancomycin IV and sent home on Keflex and doxycycline. Patient states that she is taken 3 doses or oral meds and feels that the cellulitis is spreading and getting closer to the eye.    PT Comments    Pt progressing well with ambulation with use of cane, 400' with supervision. Worked on standing balance exercises that pt can perform at home after d/c. PT will continue to follow.   Follow Up Recommendations  Home health PT;Supervision - Intermittent     Equipment Recommendations  None recommended by PT    Recommendations for Other Services       Precautions / Restrictions Precautions Precautions: Fall Precaution Comments: L face cellulitis Restrictions Weight Bearing Restrictions: No    Mobility  Bed Mobility Overal bed mobility: Modified Independent             General bed mobility comments: able to get into bed and positioned with increased time but no physical assist  Transfers Overall transfer level: Needs assistance Equipment used: None Transfers: Sit to/from Stand Sit to Stand: Supervision         General transfer comment: stands safely without AD. Worked on control ait stand to sit because pt has decreased bilateral knee control. Worked on partial squats and return to standing for better controlled sit  Ambulation/Gait Ambulation/Gait assistance: Supervision Ambulation Distance (Feet): 400 Feet Assistive device: Straight cane Gait Pattern/deviations: Step-through pattern;Decreased stride length Gait velocity: decreased Gait velocity interpretation: <1.8 ft/sec, indicative of  risk for recurrent falls General Gait Details: holds SPC in left hand, decreased pace but no LOB. Pt reports that if she feels unsteady or weak at home, she used her RW   Information systems managertairs            Wheelchair Mobility    Modified Rankin (Stroke Patients Only)       Balance Overall balance assessment: Needs assistance Sitting-balance support: Feet supported;No upper extremity supported Sitting balance-Leahy Scale: Good     Standing balance support: No upper extremity supported;During functional activity Standing balance-Leahy Scale: Fair Standing balance comment: pt stands without UE support in static position, prefers single extremity support with dynamic activity               High Level Balance Comments: performed rhomberg stance with eyes open and closed, pt able to maintain each for 30 sec without UE support, tandem stance with each foot fwd, 30 sec, able to maintain without UE support for 2-3 sec at a time, single limb stance on each foot for 30 sec, unable to maintain standing without UE support    Cognition Arousal/Alertness: Awake/alert Behavior During Therapy: WFL for tasks assessed/performed Overall Cognitive Status: Within Functional Limits for tasks assessed                      Exercises General Exercises - Lower Extremity Mini-Sqauts: AROM;5 reps;Standing    General Comments        Pertinent Vitals/Pain Pain Assessment: No/denies pain    Home Living                      Prior Function  PT Goals (current goals can now be found in the care plan section) Acute Rehab PT Goals Patient Stated Goal: to get better PT Goal Formulation: With patient Time For Goal Achievement: 06/17/15 Potential to Achieve Goals: Good Progress towards PT goals: Progressing toward goals    Frequency  Min 3X/week    PT Plan Current plan remains appropriate    Co-evaluation             End of Session Equipment Utilized During  Treatment: Gait belt Activity Tolerance: Patient tolerated treatment well Patient left: in bed;with call bell/phone within reach     Time: 1429-1453 PT Time Calculation (min) (ACUTE ONLY): 24 min  Charges:  $Gait Training: 8-22 mins $Therapeutic Activity: 8-22 mins                    G Codes:     Lyanne Co, PT  Acute Rehab Services  [phone]  Lyanne Co 06/11/2015, 3:07 PM

## 2015-06-11 NOTE — Progress Notes (Signed)
[facility] PROGRESS NOTE  Victoria Luna JXB:[id number] DOB: 02/29/1941 DOA: 06/08/2015 PCP: Malka SoJOBE,DANIEL B., MD  Assessment/Plan: 1-sepsis;  Presents with Low blood pressure, febrile leukocytosis. Facial cellulitis.  BP improved.  Repeated  lactic acid at 0.3 Hold metoprolol.  Continue with IV antibiotics.   Facial cellulitis vs abscess;  Continue with vancomycin and zosyn.  CT with possible phlegmon. Dr Jearld FentonByers consulted. Recommending to continue with IV antibiotics for now Still with induration, slight;ly improved.   3-HTN; resume metoprolol.   4-History of peripheral vascular disease. Continuing Plavix as home medication.  5. Diabetes mellitus. The patient is not aware of what medication she is taking but will place on sliding scale.  6-Dyspnea; chest x ray. Incentive spirometry.   Code Status: DNR Family Communication: care discussed with patient.  Disposition Plan: Remain inpatient.    Consultants:  ENT  Procedures:  none  Antibiotics:  Vancomycin and zosyn  HPI/Subjective: Feeling better. Swelling has decrease slightly  Objective: Filed Vitals:   06/11/15 1357  BP: 135/46  Pulse: 67  Temp: 98.7 F (37.1 C)  Resp:     Intake/Output Summary (Last 24 hours) at 06/11/15 1600 Last data filed at 06/11/15 1341  Gross per 24 hour  Intake 2265.83 ml  Output   1350 ml  Net 915.83 ml   Filed Weights   06/09/15 0548 06/10/15 0526 06/11/15 0604  Weight: 58.378 kg (128 lb 11.2 oz) 59.149 kg (130 lb 6.4 oz) 59.149 kg (130 lb 6.4 oz)    Exam:   General:  Alert in no distress.   Cardiovascular: S 1, S 2 RRR  Respiratory: crackles bases.  Abdomen: Bs present, soft, nt  Musculoskeletal: no edema  Skin, face: swelling left side face, redness. Induration 2  cm  Data Reviewed: Basic Metabolic Panel:  Recent Labs Lab 06/07/15 1909 06/08/15 1430 06/08/15 2150 06/10/15 0705 06/11/15 0515  NA 138 138 139 140 144  K 3.7 3.3* 3.9 3.7 4.5  CL  101 103 104 112* 111  CO2 26 25 28 23 23   GLUCOSE 140* 138* 162* 103* 95  BUN 26* 27* 24* 16 11  CREATININE 1.36* 1.31* 1.40* 1.22* 1.11*  CALCIUM 8.3* 8.0* 7.9* 7.0* 7.7*   Liver Function Tests:  Recent Labs Lab 06/08/15 2150  AST 16  ALT 10*  ALKPHOS 67  BILITOT 0.4  PROT 5.8*  ALBUMIN 2.8*   No results for input(s): LIPASE, AMYLASE in the last 168 hours. No results for input(s): AMMONIA in the last 168 hours. CBC:  Recent Labs Lab 06/07/15 1909 06/08/15 1430 06/08/15 2150 06/11/15 0515  WBC 13.9* 10.6* 8.0 5.8  NEUTROABS 11.4* 8.7* 6.0  --   HGB 9.8* 9.3* 8.9* 8.2*  HCT 31.9* 30.1* 28.6* 27.3*  MCV 91.4 90.9 88.5 88.1  PLT 174 162 153 171   Cardiac Enzymes: No results for input(s): CKTOTAL, CKMB, CKMBINDEX, TROPONINI in the last 168 hours. BNP (last 3 results) No results for input(s): BNP in the last 8760 hours.  ProBNP (last 3 results) No results for input(s): PROBNP in the last 8760 hours.  CBG:  Recent Labs Lab 06/10/15 1220 06/10/15 1716 06/10/15 2144 06/11/15 0810 06/11/15 1158  GLUCAP 132* 95 123* 85 155*    Recent Results (from the past 240 hour(s))  Blood culture (routine x 2)     Status: None (Preliminary result)   Collection Time: 06/08/15  2:50 PM  Result Value Ref Range Status   Specimen Description BLOOD RIGHT ANTECUBITAL  Final   Special  Requests BOTTLES DRAWN AEROBIC AND ANAEROBIC 10CC EA  Final   Culture   Final    NO GROWTH 3 DAYS Performed at [facility]    Report Status PENDING  Incomplete  Blood culture (routine x 2)     Status: None (Preliminary result)   Collection Time: 06/08/15  3:00 PM  Result Value Ref Range Status   Specimen Description BLOOD LEFT ANTECUBITAL  Final   Special Requests BOTTLES DRAWN AEROBIC AND ANAEROBIC 5CC EA  Final   Culture   Final    NO GROWTH 3 DAYS Performed at [facility]    Report Status PENDING  Incomplete  MRSA PCR Screening     Status: Abnormal   Collection Time:  06/10/15  8:56 AM  Result Value Ref Range Status   MRSA by PCR POSITIVE (A) NEGATIVE Final    Comment:        The GeneXpert MRSA Assay (FDA approved for NASAL specimens only), is one component of a comprehensive MRSA colonization surveillance program. It is not intended to diagnose MRSA infection nor to guide or monitor treatment for MRSA infections.      Studies: Dg Chest 2 View  06/11/2015   CLINICAL DATA:  Pt dealing with recent dyspnea issues - currently being treated for MRSA infection on face - has chest soreness when taking in deeper breathes - hx of htn, diabetes  EXAM: CHEST  2 VIEW  COMPARISON:  06/08/2015  FINDINGS: The cardiac silhouette is mildly enlarged. No mediastinal or hilar masses or convincing adenopathy.  There is opacity adjacent to the minor fissure in the inferior right upper lobe. This may reflect atelectasis, infectious or inflammatory infiltrate, or a combination. It is similar to the prior study. Lungs are hyperexpanded but otherwise clear. Small left and minimal right pleural effusions. A pneumothorax.  Bony thorax is demineralized but grossly intact. There are prominent arthropathic changes of both shoulders, greater on the right, stable.  IMPRESSION: 1. No significant change from the prior study when allowing for differences in technique and patient positioning. 2. Opacity in the right upper lobe adjacent to the minor fissure is again noted, which could be Luna to infection, atelectasis or a combination. 3. No convincing pulmonary edema. 4. Mild cardiomegaly and small left and minimal right pleural effusions.   Electronically Signed   By: Amie Portland M.D.   On: 06/11/2015 10:30    Scheduled Meds: . Chlorhexidine Gluconate Cloth  6 each Topical Q0600  . clopidogrel  75 mg Oral Daily  . insulin aspart  0-5 Units Subcutaneous QHS  . insulin aspart  0-9 Units Subcutaneous TID WC  . metoprolol tartrate  25 mg Oral BID  . mupirocin ointment  1 application Nasal  BID  . piperacillin-tazobactam (ZOSYN)  IV  3.375 g Intravenous Q8H  . sodium chloride  3 mL Intravenous Q12H  . vancomycin  1,000 mg Intravenous Q48H   Continuous Infusions:    Principal Problem:   Cellulitis, face Active Problems:   Essential hypertension   Diabetes mellitus type 2 in nonobese   Dyslipidemia   Sepsis    Time spent: 35 minutes.     Hartley Barefoot A  [facility] Pager [phone]. If 7PM-7AM, please contact night-coverage at [email or web address], password [facility] 06/11/2015, 4:00 PM  LOS: 3 days

## 2015-06-12 LAB — GLUCOSE, CAPILLARY
GLUCOSE-CAPILLARY: 106 mg/dL — AB (ref 65–99)
GLUCOSE-CAPILLARY: 109 mg/dL — AB (ref 65–99)
Glucose-Capillary: 139 mg/dL — ABNORMAL HIGH (ref 65–99)
Glucose-Capillary: 120 mg/dL — ABNORMAL HIGH (ref 65–99)

## 2015-06-12 NOTE — Progress Notes (Deleted)
Patient complaining of pain at IV site on right arm. No signs of infiltration or phlebitis. Pharmacy contacted to assess for reaction to doxy IV currently infusing. IV abx stopped and pain not relieved. IV flushed with NS and pain continued. Patient crying stating pain 8/10 above IV site up to 4". Cap refill less than 3 seconds, palpable radial pulse 2+ in right arm. Arm raised above level of heart and patient stated relief of pain to 4/10. Heat applied.  MD contacted and made aware. IV discontinued and order to place new IV in left forearm.

## 2015-06-12 NOTE — Progress Notes (Signed)
[facility] PROGRESS NOTE  Jamieka Royle ZOX:[id number] DOB: 03-05-1941 DOA: 06/08/2015 PCP: Malka So., MD  Assessment/Plan: Victoria Luna is a 74 y.o. female with Past medical history of hypertension, diabetes mellitus, peripheral vascular disease.The patient is presenting with complaints of redness and swelling of her face. Admitted with cellulitis face, she had induration. CT negative for abscess. ENT consulted recommend to continue IV antibiotics. Follow on improvement of induration.   1-sepsis;  Presents with Low blood pressure, febrile leukocytosis. Facial cellulitis.  BP improved.  Repeated  lactic acid at 0.3 Hold metoprolol.  Continue with IV antibiotics.   Facial cellulitis vs abscess;  Continue with vancomycin. Will stop Zosyn patient report diarrhea.  CT with possible phlegmon. Dr Jearld Fenton consulted. Recommending to continue with IV antibiotics for now Induration slightly decreased in size.   3-HTN; resume metoprolol.   4-History of peripheral vascular disease. Continuing Plavix as home medication.  5. Diabetes mellitus. The patient is not aware of what medication she is taking but will place on sliding scale.  6-Dyspnea; improved. chest x ray atelectasis. Incentive spirometry.  7-diarrhea; she report history of chronic diarrhea. Had episode last night. If diarrhea persist will consu=ider checking for C diff. Will discontinue zosyn.   Code Status: DNR Family Communication: care discussed with patient.  Disposition Plan: Remain inpatient.    Consultants:  ENT  Procedures:  none  Antibiotics:  Vancomycin   Zosyn stopped 6-30  HPI/Subjective: Feeling better. Swelling has decreased, induration slightly decreased.  eport diarrhea overnight.   Objective: Filed Vitals:   06/12/15 1026  BP: 149/47  Pulse: 67  Temp:   Resp:     Intake/Output Summary (Last 24 hours) at 06/12/15 1040 Last data filed at 06/12/15 1031  Gross per 24 hour  Intake     460 ml  Output    600 ml  Net   -140 ml   Filed Weights   06/10/15 0526 06/11/15 0604 06/12/15 0536  Weight: 59.149 kg (130 lb 6.4 oz) 59.149 kg (130 lb 6.4 oz) 57.834 kg (127 lb 8 oz)    Exam:   General:  Alert in no distress.   Cardiovascular: S 1, S 2 RRR  Respiratory: crackles bases.  Abdomen: Bs present, soft, nt  Musculoskeletal: no edema  Skin, face: swelling left side face, redness. Induration 2  cm  Data Reviewed: Basic Metabolic Panel:  Recent Labs Lab 06/07/15 1909 06/08/15 1430 06/08/15 2150 06/10/15 0705 06/11/15 0515  NA 138 138 139 140 144  K 3.7 3.3* 3.9 3.7 4.5  CL 101 103 104 112* 111  CO2 GLUCOSE 140* 138* 162* 103* 95  BUN 26* 27* 24* 16 11  CREATININE 1.36* 1.31* 1.40* 1.22* 1.11*  CALCIUM 8.3* 8.0* 7.9* 7.0* 7.7*   Liver Function Tests:  Recent Labs Lab 06/08/15 2150  AST 16  ALT 10*  ALKPHOS 67  BILITOT 0.4  PROT 5.8*  ALBUMIN 2.8*   No results for input(s): LIPASE, AMYLASE in the last 168 hours. No results for input(s): AMMONIA in the last 168 hours. CBC:  Recent Labs Lab 06/07/15 1909 06/08/15 1430 06/08/15 2150 06/11/15 0515  WBC 13.9* 10.6* 8.0 5.8  NEUTROABS 11.4* 8.7* 6.0  --   HGB 9.8* 9.3* 8.9* 8.2*  HCT 31.9* 30.1* 28.6* 27.3*  MCV 91.4 90.9 88.5 88.1  PLT 174 162 153 171   Cardiac Enzymes: No results for input(s): CKTOTAL, CKMB, CKMBINDEX, TROPONINI in the last 168 hours. BNP (last 3 results)  No results for input(s): BNP in the last 8760 hours.  ProBNP (last 3 results) No results for input(s): PROBNP in the last 8760 hours.  CBG:  Recent Labs Lab 06/11/15 0810 06/11/15 1158 06/11/15 1717 06/11/15 2219 06/12/15 0725  GLUCAP 85 155* 81 90 106*    Recent Results (from the past 240 hour(s))  Blood culture (routine x 2)     Status: None (Preliminary result)   Collection Time: 06/08/15  2:50 PM  Result Value Ref Range Status   Specimen Description BLOOD RIGHT ANTECUBITAL  Final    Special Requests BOTTLES DRAWN AEROBIC AND ANAEROBIC 10CC EA  Final   Culture   Final    NO GROWTH 3 DAYS [facility]    Report Status PENDING  Incomplete  Blood culture (routine x 2)     Status: None (Preliminary result)   Collection Time: 06/08/15  3:00 PM  Result Value Ref Range Status   Specimen Description BLOOD LEFT ANTECUBITAL  Final   Special Requests BOTTLES DRAWN AEROBIC AND ANAEROBIC 5CC EA  Final   Culture   Final    NO GROWTH 3 DAYS Performed [facility]    Report Status PENDING  Incomplete  MRSA PCR Screening     Status: Abnormal   Collection Time: 06/10/15  8:56 AM  Result Value Ref Range Status   MRSA by PCR POSITIVE (A) NEGATIVE Final    Comment:        The GeneXpert MRSA Assay (FDA approved for NASAL specimens only), is one component of a comprehensive MRSA colonization surveillance program. It is not intended to diagnose MRSA infection nor to guide or monitor treatment for MRSA infections.      Studies: Dg Chest 2 View  06/11/2015   CLINICAL DATA:  Pt dealing with recent dyspnea issues - currently being treated for MRSA infection on face - has chest soreness when taking in deeper breathes - hx of htn, diabetes  EXAM: CHEST  2 VIEW  COMPARISON:  06/08/2015  FINDINGS: The cardiac silhouette is mildly enlarged. No mediastinal or hilar masses or convincing adenopathy.  There is opacity adjacent to the minor fissure in the inferior right upper lobe. This may reflect atelectasis, infectious or inflammatory infiltrate, or a combination. It is similar to the prior study. Lungs are hyperexpanded but otherwise clear. Small left and minimal right pleural effusions. A pneumothorax.  Bony thorax is demineralized but grossly intact. There are prominent arthropathic changes of both shoulders, greater on the right, stable.  IMPRESSION: 1. No significant change from the prior study when allowing for differences in technique and patient  positioning. 2. Opacity in the right upper lobe adjacent to the minor fissure is again noted, which could be due to infection, atelectasis or a combination. 3. No convincing pulmonary edema. 4. Mild cardiomegaly and small left and minimal right pleural effusions.   Electronically Signed   By: Amie Portlandavid  Ormond M.D.   On: 06/11/2015 10:30    Scheduled Meds: . Chlorhexidine Gluconate Cloth  6 each Topical Q0600  . clopidogrel  75 mg Oral Daily  . insulin aspart  0-5 Units Subcutaneous QHS  . insulin aspart  0-9 Units Subcutaneous TID WC  . metoprolol tartrate  25 mg Oral BID  . mupirocin ointment  1 application Nasal BID  . sodium chloride  3 mL Intravenous Q12H  . vancomycin  1,000 mg Intravenous Q48H   Continuous Infusions:    Principal Problem:   Cellulitis, face Active  Problems:   Essential hypertension   Diabetes mellitus type 2 in nonobese   Dyslipidemia   Sepsis    Time spent: 35 minutes.     Hartley Barefoot A  [facility] Pager [phone]. If 7PM-7AM, please contact night-coverage at [email or web address], password [facility] 06/12/2015, 10:40 AM  LOS: 4 days

## 2015-06-12 NOTE — Progress Notes (Signed)
Physical Therapy Treatment Patient Details Name: Victoria Luna MRN: [id number] DOB: 02/27/1941 Today's Date: 06/12/2015    History of Present Illness 74 y.o. female with past medical history significant for hypertension, high cholesterol, non-insulin-dependent diabetes complaining of worsening cellulitis to left cheek. Patient was seen for similar yesterday, she was given a dose of vancomycin IV and sent home on Keflex and doxycycline. Patient states that she is taken 3 doses or oral meds and feels that the cellulitis is spreading and getting closer to the eye.    PT Comments    Pt demonstrating overall improvement with mobility and progressing to her baseline mobility. See below for treatment details. PT focusing on challenging balance and addressing overall strengthening to decrease fall risk. Continue to address these impairments to prepare for d/c home with family.   Follow Up Recommendations  Home health PT;Supervision - Intermittent     Equipment Recommendations  None recommended by PT    Recommendations for Other Services       Precautions / Restrictions Precautions Precautions: Fall Precaution Comments: L face cellulitis Restrictions Weight Bearing Restrictions: No    Mobility  Bed Mobility Overal bed mobility: Modified Independent             General bed mobility comments: no assist needed for supine to sit  Transfers Overall transfer level: Modified independent Equipment used: None Transfers: Sit to/from Stand Sit to Stand: Modified independent (Device/Increase time)         General transfer comment: Performed basic transfers at mod I level including transfer on and off of toilet (used grab bar for support for balance for hygiene and to change her depends.  Ambulation/Gait Ambulation/Gait assistance: Supervision;Min guard Ambulation Distance (Feet): 400 Feet Assistive device: None Gait Pattern/deviations: Decreased stride length     General Gait  Details: did not use AD to challenge balance and able to perform gait at S level intermittent min guard at times as pt wanting to use rail for support.   Stairs            Wheelchair Mobility    Modified Rankin (Stroke Patients Only)       Balance Overall balance assessment: Needs assistance Sitting-balance support: Feet supported;No upper extremity supported Sitting balance-Leahy Scale: Good     Standing balance support: Single extremity supported;No upper extremity supported;During functional activity Standing balance-Leahy Scale: Good Standing balance comment: Performing functional balance with hygiene and transfers without use of UE support                    Cognition Arousal/Alertness: Awake/alert Behavior During Therapy: WFL for tasks assessed/performed Overall Cognitive Status: Within Functional Limits for tasks assessed                      Exercises General Exercises - Lower Extremity Toe Raises: Strengthening;Both;10 reps;Standing Heel Raises: Strengthening;Both;10 reps;Standing Mini-Sqauts: Strengthening;5 reps;Standing    General Comments        Pertinent Vitals/Pain Pain Assessment: No/denies pain    Home Living                      Prior Function            PT Goals (current goals can now be found in the care plan section) Acute Rehab PT Goals PT Goal Formulation: With patient Time For Goal Achievement: 06/17/15 Potential to Achieve Goals: Good Progress towards PT goals: Progressing toward goals    Frequency  Min 3X/week  PT Plan Current plan remains appropriate    Co-evaluation             End of Session   Activity Tolerance: Patient tolerated treatment well Patient left: in chair;with call bell/phone within reach     Time: 1335-1403 PT Time Calculation (min) (ACUTE ONLY): 28 min  Charges:  $Gait Training: 8-22 mins $Therapeutic Activity: 8-22 mins                    G Codes:      Karolee Stamps Darrol Poke, PT, DPT Pager #: [phone]  06/12/2015, 2:28 PM

## 2015-06-12 NOTE — Care Management (Signed)
Important Message  Patient Details  Name: Victoria SicksBetty Luna MRN: [id number] Date of Birth: 10/14/1941   Medicare Important Message Given:  Yes-third notification given    Leone Havenaylor, Clarann Helvey Clinton, RN 06/12/2015, 3:30 PM

## 2015-06-13 DIAGNOSIS — A4102 Sepsis due to Methicillin resistant Staphylococcus aureus: Secondary | ICD-10-CM

## 2015-06-13 DIAGNOSIS — E785 Hyperlipidemia, unspecified: Secondary | ICD-10-CM

## 2015-06-13 DIAGNOSIS — R5381 Other malaise: Secondary | ICD-10-CM

## 2015-06-13 DIAGNOSIS — N179 Acute kidney failure, unspecified: Secondary | ICD-10-CM

## 2015-06-13 DIAGNOSIS — L03211 Cellulitis of face: Secondary | ICD-10-CM

## 2015-06-13 DIAGNOSIS — I10 Essential (primary) hypertension: Secondary | ICD-10-CM

## 2015-06-13 DIAGNOSIS — E119 Type 2 diabetes mellitus without complications: Secondary | ICD-10-CM

## 2015-06-13 LAB — BASIC METABOLIC PANEL
ANION GAP: 12 (ref 5–15)
BUN: 10 mg/dL (ref 6–20)
CALCIUM: 8.4 mg/dL — AB (ref 8.9–10.3)
CO2: 22 mmol/L (ref 22–32)
Chloride: 110 mmol/L (ref 101–111)
Creatinine, Ser: 0.98 mg/dL (ref 0.44–1.00)
GFR calc Af Amer: >60 mL/min (ref >60)
GFR calc non Af Amer: 55 mL/min — ABNORMAL LOW (ref >60)
GLUCOSE: 91 mg/dL (ref 65–99)
POTASSIUM: 3.9 mmol/L (ref 3.5–5.1)
SODIUM: 144 mmol/L (ref 135–145)

## 2015-06-13 LAB — CULTURE, BLOOD (ROUTINE X 2)
Culture: NO GROWTH
Culture: NO GROWTH

## 2015-06-13 LAB — CBC
HCT: 28.8 % — ABNORMAL LOW (ref 36.0–46.0)
Hemoglobin: 9.1 g/dL — ABNORMAL LOW (ref 12.0–15.0)
MCH: 27.6 pg (ref 26.0–34.0)
MCHC: 31.6 g/dL (ref 30.0–36.0)
MCV: 87.3 fL (ref 78.0–100.0)
Platelets: 197 10*3/uL (ref 150–400)
RBC: 3.3 MIL/uL — ABNORMAL LOW (ref 3.87–5.11)
RDW: 15.3 % (ref 11.5–15.5)
WBC: 7.6 10*3/uL (ref 4.0–10.5)

## 2015-06-13 LAB — GLUCOSE, CAPILLARY
GLUCOSE-CAPILLARY: 104 mg/dL — AB (ref 65–99)
Glucose-Capillary: 152 mg/dL — ABNORMAL HIGH (ref 65–99)

## 2015-06-13 MED ORDER — LINEZOLID 600 MG PO TABS: 600.0000 mg | ORAL_TABLET | Freq: Two times a day (BID) | ORAL | Status: AC

## 2015-06-13 NOTE — Discharge Summary (Signed)
Physician Discharge Summary  Victoria SicksBetty Luna ZOX:[id number] DOB: 12/31/1940 DOA: 06/08/2015  PCP: Malka SoJOBE,DANIEL B., MD  Admit date: 06/08/2015 Discharge date: 06/13/2015  Time spent: >30 minutes  Recommendations for Outpatient Follow-up:  1. Repeat basic metabolic panel to assess in the function and electrolytes 2. Repeat CBC to follow WBCs and hemoglobin 3. Follow resolution of left facial induration/cellulitis and if needed please make appropriate referral to ENT  Discharge Diagnoses:  Principal Problem:   Cellulitis, face Active Problems:   Essential hypertension   Diabetes mellitus type 2 in nonobese   Dyslipidemia   Sepsis AKI Physical deconditioning  Discharge Condition: stable and improved. Discharge home with HHPT for conditioning. Patient to follow with PCP in 2 weeks  Diet recommendation: heart healthy diet and low carbohydrates  Filed Weights   06/11/15 0604 06/12/15 0536 06/13/15 0703  Weight: 59.149 kg (130 lb 6.4 oz) 57.834 kg (127 lb 8 oz) 56.518 kg (124 lb 9.6 oz)    History of present illness:  [age] y.o. female with Past medical history of hypertension, diabetes mellitus, peripheral vascular disease.The patient is presenting with complaints of redness and swelling of her face. Admitted with cellulitis face, she had induration. CT negative for abscess. ENT consulted recommend to continue IV antibiotics. Patient admitted to the hospital for further evaluation and treatment.  Hospital Course:  1-sepsis; due to facial cellulitis Presents with Low blood pressure, febrile and with leukocytosis.  At discharge no fever, no elevation of WBC's and BP WNL.  Repeated lactic acid WNL Will discharge on zyvox to complete antibiotics therapy  2-Facial cellulitis vs abscess;  -Initially treated with vancomycin IV for 5 days  -Patient has been discharged on Zyvox  -CT with possible phlegmon. Dr Jearld FentonByers consulted. Recommending to continue treatment with antibiotics and warm  compresses. Outpatient follow-up in as needed basis -Face Induration decreased in size, without erythema and nontender abdomen with discharge   3-HTN;  -A stable and fairly well-controlled -Advised to follow heart healthy diet and will continue home antihypertensive regimen.  4-History of peripheral vascular disease. -Continuing Plavix  -No cyanosis or claudication toting inpatient evaluation  5. Diabetes mellitus. -Advised to follow low carbohydrates diet -Continue home hypoglycemic regimen and further adjustment per PCP as needed  6-Dyspnea -Resolved at discharge. -chest x-ray with atelectasis, but no frank infiltrates or congestion. Incentive spirometry.   7-diarrhea; she report history of chronic diarrhea. -Most likely slightly worse with use of antibiotics -After Zosyn was discontinue patient diarrhea/loose stool has significantly improved -Patient advised to use probiotics over-the-counter  8-physical deconditioning: Discharge home with home health physical therapy for rehabilitation  9-acute kidney injury: Appears to be prerenal azotemia in nature. -Resolved with fluid resuscitation -At discharge patient kidney function was 0.98  Procedures:  See below for x-ray reports  Consultations:  ENT  Discharge Exam: Filed Vitals:   06/13/15 1357  BP: 146/76  Pulse: 76  Temp: 98.6 F (37 C)  Resp: 16    General: Alert, awake and oriented 3; no acute distress. Patient is afebrile and with just mild left facial swelling and no pain on palpation Cardiovascular: S1 and S2, no rubs, no gallops, RRR; no JVD Respiratory: Clear to auscultation bilaterally Abdomen: Soft, nontender, nondistended, positive bowel sounds  Discharge Instructions   Discharge Instructions    Diet - low sodium heart healthy    Complete by:  As directed      Discharge instructions    Complete by:  As directed   Take medications as prescribed Follow a  heart healthy and low carbohydrates  diet Arrange follow-up with PCP in the next 2 weeks Keep herself well-hydrated          Current Discharge Medication List    START taking these medications   Details  linezolid (ZYVOX) 600 MG tablet Take 1 tablet (600 mg total) by mouth 2 (two) times daily. Qty: 20 tablet, Refills: 0      CONTINUE these medications which have NOT CHANGED   Details  amLODipine (NORVASC) 5 MG tablet Take 5 mg by mouth daily.    aspirin 325 MG tablet Take 325 mg by mouth daily.    atorvastatin (LIPITOR) 40 MG tablet Take 40 mg by mouth at bedtime.    Cholecalciferol (VITAMIN D3) 2000 UNITS capsule Take 2,000 Units by mouth daily.     clopidogrel (PLAVIX) 75 MG tablet Take 75 mg by mouth daily.    hydrALAZINE (APRESOLINE) 50 MG tablet Take 50 mg by mouth 3 (three) times daily.    hydrochlorothiazide (HYDRODIURIL) 50 MG tablet Take 50 mg by mouth daily.    loperamide (IMODIUM A-D) 2 MG tablet Take 4 mg by mouth daily as needed for diarrhea or loose stools.     metoprolol tartrate (LOPRESSOR) 25 MG tablet Take 25 mg by mouth 2 (two) times daily.    pantoprazole (PROTONIX) 40 MG tablet Take 40 mg by mouth daily.    pioglitazone (ACTOS) 15 MG tablet Take 15 mg by mouth daily.    rOPINIRole (REQUIP) 0.25 MG tablet Take 0.25 mg by mouth at bedtime.    sertraline (ZOLOFT) 100 MG tablet Take 100 mg by mouth daily.    Suvorexant (BELSOMRA) 5 MG TABS Take 5 mg by mouth at bedtime.      STOP taking these medications     cephALEXin (KEFLEX) 500 MG capsule      doxycycline (VIBRAMYCIN) 100 MG capsule        Allergies  Allergen Reactions  . Codeine   . Hydromorphone Other (See Comments)  . Levaquin [Levofloxacin In D5w]     hallucinations  . Pravastatin     Stomach pains   Follow-up Information    Follow up with [facility].   Why:  HHPT   Contact information:   [address] [place] [address] [phone]       Follow up with JOBE,DANIEL B., MD.  Schedule an appointment as soon as possible for a visit in 2 weeks.   Specialty:  Internal Medicine   Contact information:   [address] [place] [address] [phone]       The results of significant diagnostics from this hospitalization (including imaging, microbiology, ancillary and laboratory) are listed below for reference.    Significant Diagnostic Studies: Dg Chest 2 View  06/11/2015   CLINICAL DATA:  Pt dealing with recent dyspnea issues - currently being treated for MRSA infection on face - has chest soreness when taking in deeper breathes - hx of htn, diabetes  EXAM: CHEST  2 VIEW  COMPARISON:  06/08/2015  FINDINGS: The cardiac silhouette is mildly enlarged. No mediastinal or hilar masses or convincing adenopathy.  There is opacity adjacent to the minor fissure in the inferior right upper lobe. This may reflect atelectasis, infectious or inflammatory infiltrate, or a combination. It is similar to the prior study. Lungs are hyperexpanded but otherwise clear. Small left and minimal right pleural effusions. A pneumothorax.  Bony thorax is demineralized but grossly intact. There are prominent  arthropathic changes of both shoulders, greater on the right, stable.  IMPRESSION: 1. No significant change from the prior study when allowing for differences in technique and patient positioning. 2. Opacity in the right upper lobe adjacent to the minor fissure is again noted, which could be due to infection, atelectasis or a combination. 3. No convincing pulmonary edema. 4. Mild cardiomegaly and small left and minimal right pleural effusions.   Electronically Signed   By: Amie Portland M.D.   On: 06/11/2015 10:30   Dg Chest Port 1 View  06/08/2015   CLINICAL DATA:  Shortness of breath.  Hypoxia.  Symptoms for 1 day.  EXAM: PORTABLE CHEST - 1 VIEW  COMPARISON:  None.  FINDINGS: The heart is at the upper limits of normal in size. There is atherosclerosis of the thoracic aorta. There is  central bronchial thickening and increased central bronchogenic markings. Ill-defined linear density in the right midlung zone in the region of the minor fissure, may reflect small amount pleural fluid versus scarring. Minimal blunting of left costophrenic angle and minimal increased density in the periphery of the left lower lung zone. No confluent airspace disease. No pneumothorax. No pulmonary edema. There is severe degenerative change about the right shoulder.  IMPRESSION: 1. Ill-defined linear density in the right midlung zone, may reflect scarring or small amount of fluid in the minor fissure. 2. Bronchial thickening and increased central bronchogenic markings, this may be chronic, however no prior exams are available for comparison. Bronchitis could have a similar appearance.   Electronically Signed   By: Rubye Oaks M.D.   On: 06/08/2015 17:03   Ct Maxillofacial Wo Cm  06/09/2015   CLINICAL DATA:  Left-sided cheek and peri-orbital swelling and pain. Facial cellulitis with induration of skin.  EXAM: CT MAXILLOFACIAL WITHOUT CONTRAST  TECHNIQUE: Multidetector CT imaging of the maxillofacial structures was performed. Multiplanar CT image reconstructions were also generated. A small metallic BB was placed on the right temple in order to reliably differentiate right from left.  COMPARISON:  None.  FINDINGS: Left facial skin thickening and soft tissue edema. Scattered small ill-defined soft tissue densities in the region of soft tissue edema measuring up to 11 mm in the subcutaneous tissues. There is no fluid collection or abscess. No soft tissue air. There are no bony destructive changes. Patient is edentulous of the upper teeth, no periapical lucencies about the in situ lower teeth. Both orbits and globes are intact. There is no facial bone fracture. Right carotid stent and surgical clips adjacent to the left carotid artery noted. Paranasal sinuses and mastoid air cells are well aerated. Incidental note  of degenerative change in the included cervical spine with degenerative-type anterolisthesis of C3 on C4 and C4 on C5.  IMPRESSION: Skin thickening and soft tissue edema about the left face, consistent with stated history of cellulitis. There are small rounded soft tissue densities in the region of edema, may reflect lymph nodes, phlegmonous change versus less likely small hematomas. There is no abscess or soft tissue air.   Electronically Signed   By: Rubye Oaks M.D.   On: 06/09/2015 00:47    Microbiology: Recent Results (from the past 240 hour(s))  Blood culture (routine x 2)     Status: None   Collection Time: 06/08/15  2:50 PM  Result Value Ref Range Status   Specimen Description BLOOD RIGHT ANTECUBITAL  Final   Special Requests BOTTLES DRAWN AEROBIC AND ANAEROBIC 10CC EA  Final   Culture   Final  NO GROWTH 5 DAYS Performed at [facility]    Report Status 06/13/2015 FINAL  Final  Blood culture (routine x 2)     Status: None   Collection Time: 06/08/15  3:00 PM  Result Value Ref Range Status   Specimen Description BLOOD LEFT ANTECUBITAL  Final   Special Requests BOTTLES DRAWN AEROBIC AND ANAEROBIC 5CC EA  Final   Culture   Final    NO GROWTH 5 DAYS Performed at [facility]    Report Status 06/13/2015 FINAL  Final  MRSA PCR Screening     Status: Abnormal   Collection Time: 06/10/15  8:56 AM  Result Value Ref Range Status   MRSA by PCR POSITIVE (A) NEGATIVE Final    Comment:        The GeneXpert MRSA Assay (FDA approved for NASAL specimens only), is one component of a comprehensive MRSA colonization surveillance program. It is not intended to diagnose MRSA infection nor to guide or monitor treatment for MRSA infections.      Labs: Basic Metabolic Panel:  Recent Labs Lab 06/08/15 1430 06/08/15 2150 06/10/15 0705 06/11/15 0515 06/13/15 0428  NA 138 139 140 144 144  K 3.3* 3.9 3.7 4.5 3.9  CL 103 104 112* 111 110  CO2 25 28 23 23 22    GLUCOSE 138* 162* 103* 95 91  BUN 27* 24* 16 11 10   CREATININE 1.31* 1.40* 1.22* 1.11* 0.98  CALCIUM 8.0* 7.9* 7.0* 7.7* 8.4*   Liver Function Tests:  Recent Labs Lab 06/08/15 2150  AST 16  ALT 10*  ALKPHOS 67  BILITOT 0.4  PROT 5.8*  ALBUMIN 2.8*   CBC:  Recent Labs Lab 06/07/15 1909 06/08/15 1430 06/08/15 2150 06/11/15 0515 06/13/15 0428  WBC 13.9* 10.6* 8.0 5.8 7.6  NEUTROABS 11.4* 8.7* 6.0  --   --   HGB 9.8* 9.3* 8.9* 8.2* 9.1*  HCT 31.9* 30.1* 28.6* 27.3* 28.8*  MCV 91.4 90.9 88.5 88.1 87.3  PLT 174 162 153 171 197   CBG:  Recent Labs Lab 06/12/15 1237 06/12/15 1654 06/12/15 2105 06/13/15 0819 06/13/15 1214  GLUCAP 139* 120* 109* 104* 152*    Signed:  Vassie Loll  [facility] 06/13/2015, 4:08 PM

## 2015-06-13 NOTE — Care Management Note (Signed)
Case Management Note  Patient Details  Name: Julio SicksBetty Mabin MRN: [id number] Date of Birth: 07/23/1941  Subjective/Objective:    Patient for possible dc today, [facility], [facility], Miranda notified.  Soc will begin 24-48 hrs post dc.                Action/Plan:   Expected Discharge Date:                  Expected Discharge Plan:  Home w Home Health Services  In-House Referral:     Discharge planning Services  CM Consult  Post Acute Care Choice:    Choice offered to:  Patient  DME Arranged:    DME Agency:     HH Arranged:  PT HH Agency:  [facility]  Status of Service:  Completed, signed off  Medicare Important Message Given:  Yes-third notification given Date Medicare IM Given:    Medicare IM give by:    Date Additional Medicare IM Given:    Additional Medicare Important Message give by:     If discussed at Long Length of Stay Meetings, dates discussed:    Additional Comments:  Leone Havenaylor, Bryna Razavi Clinton, RN 06/13/2015, 12:42 PM

## 2015-06-13 NOTE — Progress Notes (Signed)
NURSING PROGRESS NOTE  Victoria Luna [id number] Discharge Data: 06/13/2015 7:39 PM Attending Provider: No att. providers found WUJ:WJXB,JYNWGNPCP:JOBE,DANIEL B., MD   Victoria SicksBetty Luna to be D/C'd Home per MD order.    All IV's will be discontinued and monitored for bleeding.  All belongings will be returned to patient for patient to take home.  Last Documented Vital Signs:  Blood pressure 146/76, pulse 76, temperature 98.6 F (37 C), temperature source Oral, resp. rate 16, height 4' 1.32" (1.253 m), weight 56.518 kg (124 lb 9.6 oz), SpO2 93 %.  Madelin RearLonnie Bobbe Quilter, MSN, RN, [employer]RN

## 2016-09-03 IMAGING — CT CT MAXILLOFACIAL W/O CM
3 of 4 series · 15 of 47 positions shown, 18 images · non-contrast
Comparison: None.

CLINICAL DATA: Left-sided cheek and peri-orbital swelling and pain.
Facial cellulitis with induration of skin.

EXAM:
CT MAXILLOFACIAL WITHOUT CONTRAST
TECHNIQUE: Multidetector CT imaging of the maxillofacial structures was
performed. Multiplanar CT image reconstructions were also generated.
A small metallic BB was placed on the right temple in order to
reliably differentiate right from left.

[Series 5: facial/ orbits 2.0 h30s · axial · 0.34mm/px · z∈[-212,-84]mm · 9 of 78 slices shown, 12 images]
[im 7/78  brain]
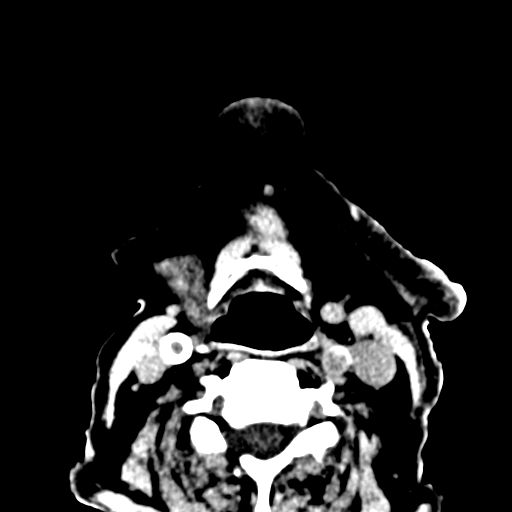
[im 7/78  bone]
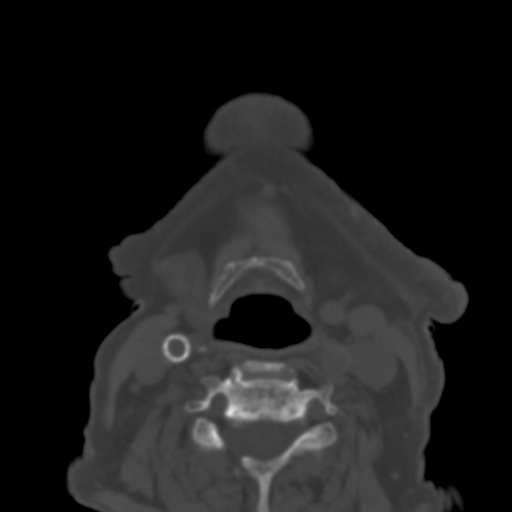
[im 13/78  bone]
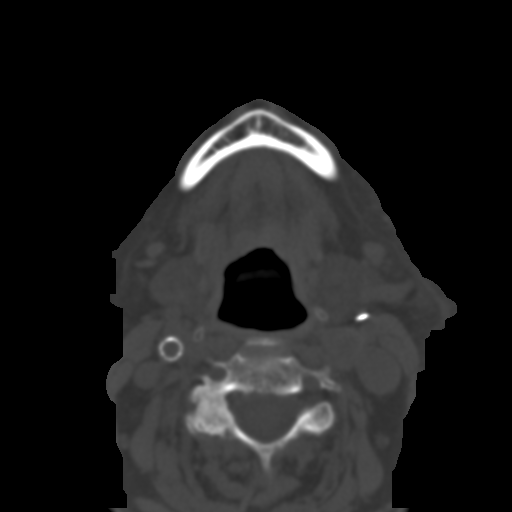
[im 23/78  bone]
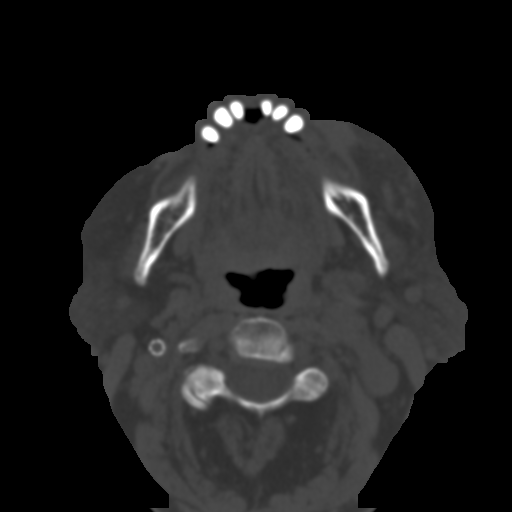
[im 29/78  bone]
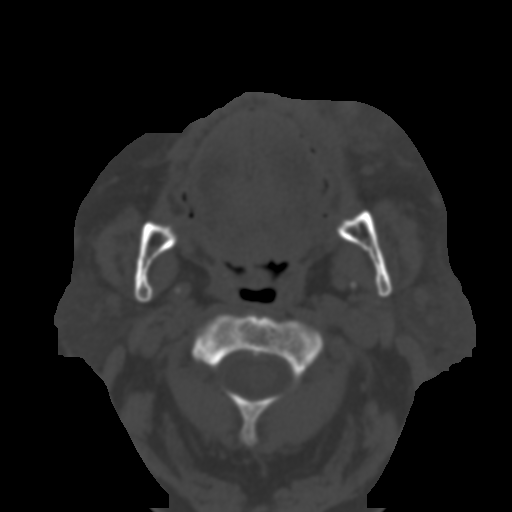
[im 39/78  brain]
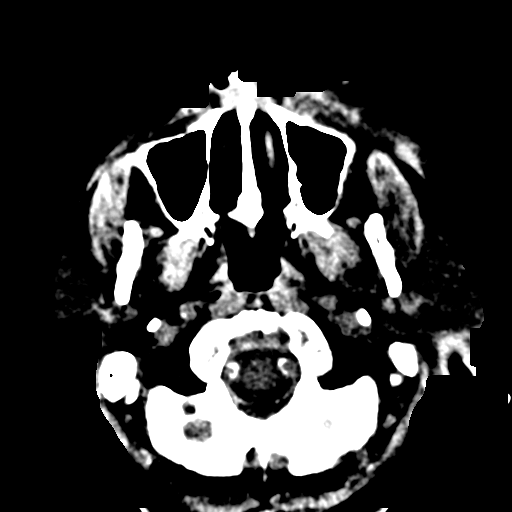
[im 39/78  bone]
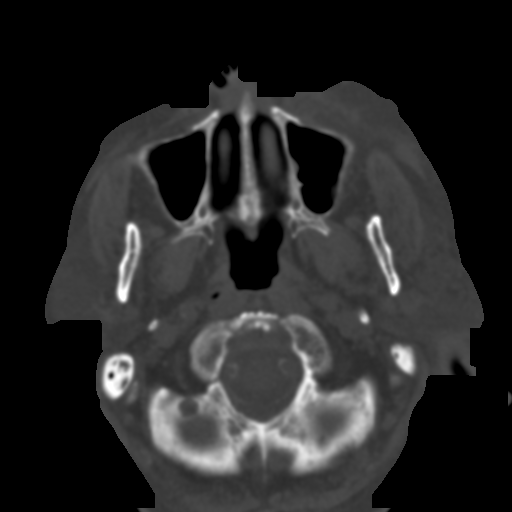
[im 49/78  bone]
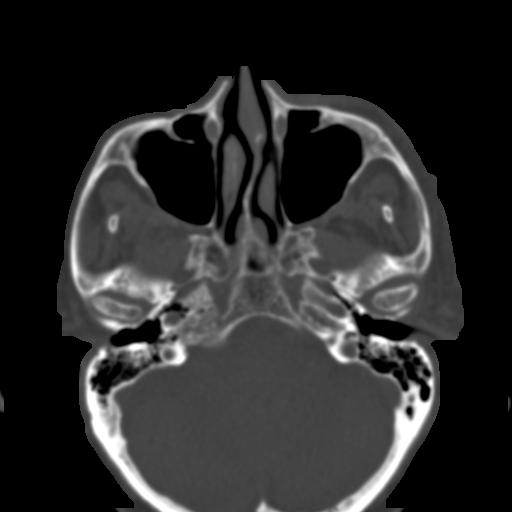
[im 55/78  bone]
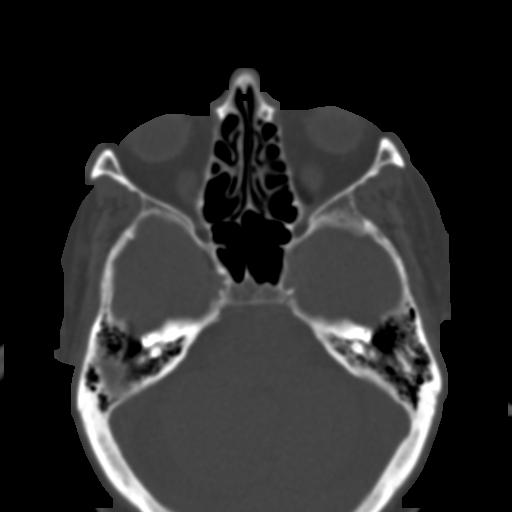
[im 65/78  bone]
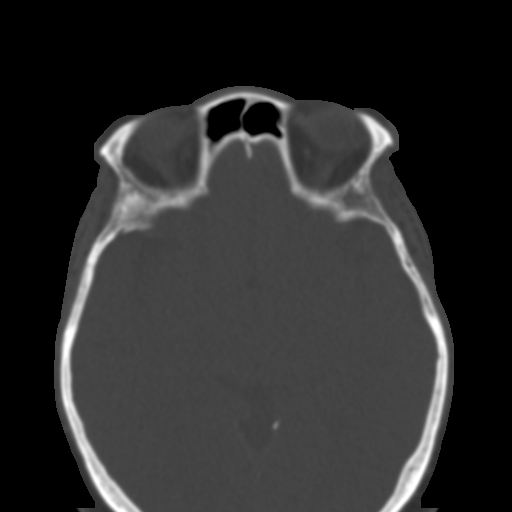
[im 71/78  brain]
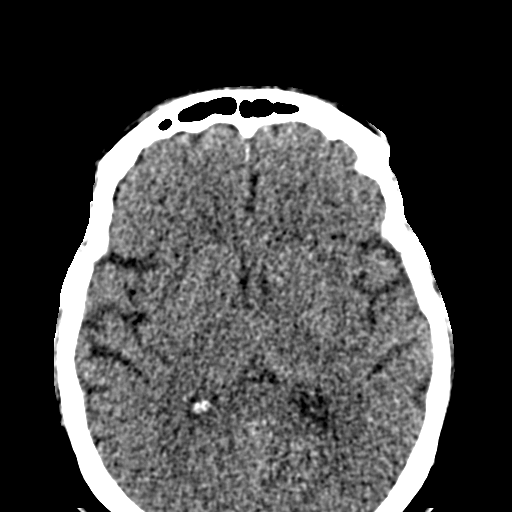
[im 71/78  bone]
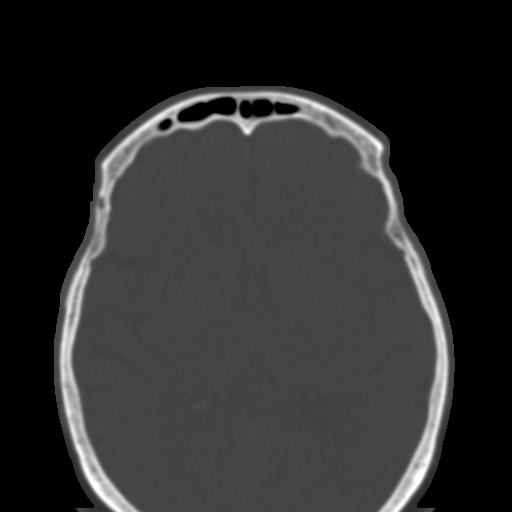

[Series 6: coronal st · coronal · 0.31mm/px · 3 of 86 slices shown]
[im 29/86  bone]
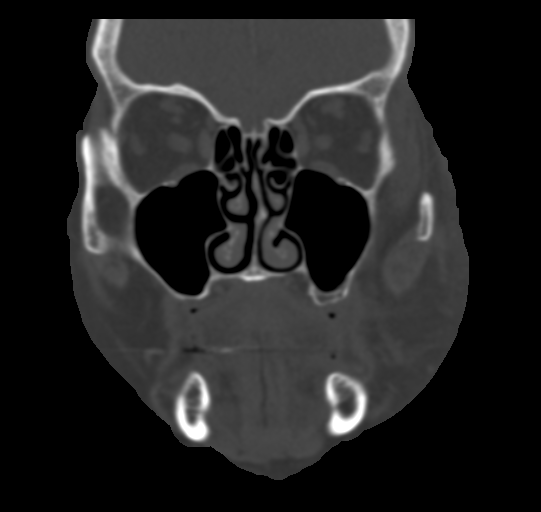
[im 38/86  bone]
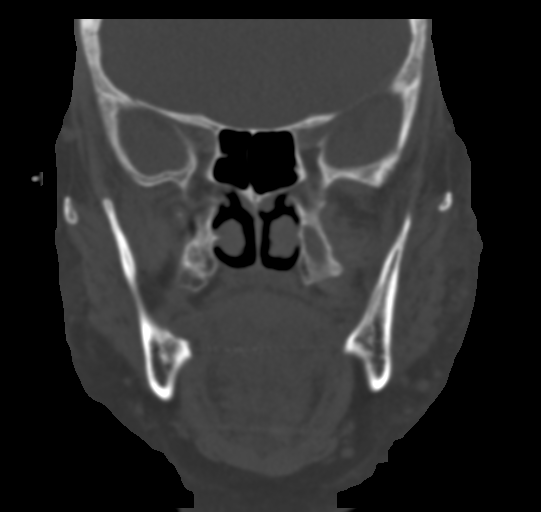
[im 48/86  bone]
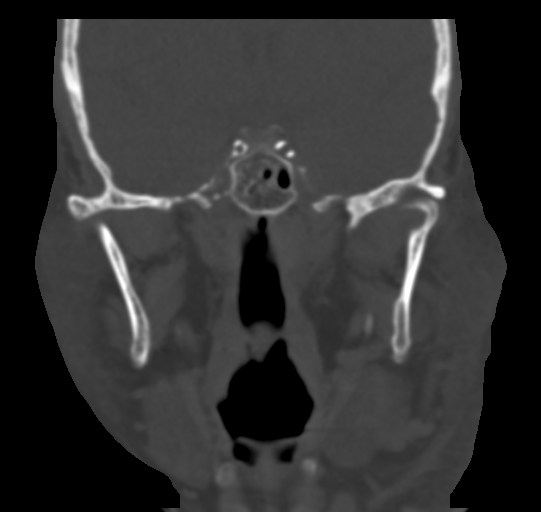

[Series 7: sagittal st · sagittal · 0.30mm/px · 3 of 80 slices shown]
[im 27/80  bone]
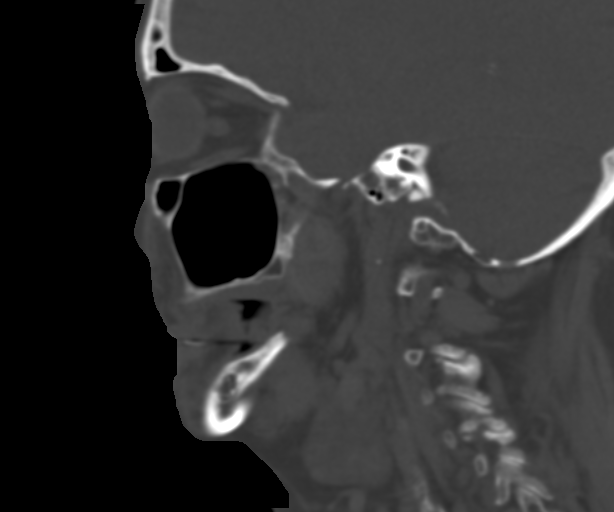
[im 40/80  bone]
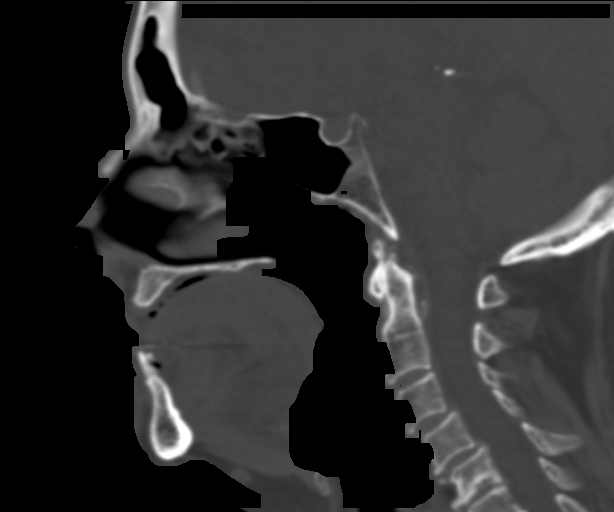
[im 53/80  bone]
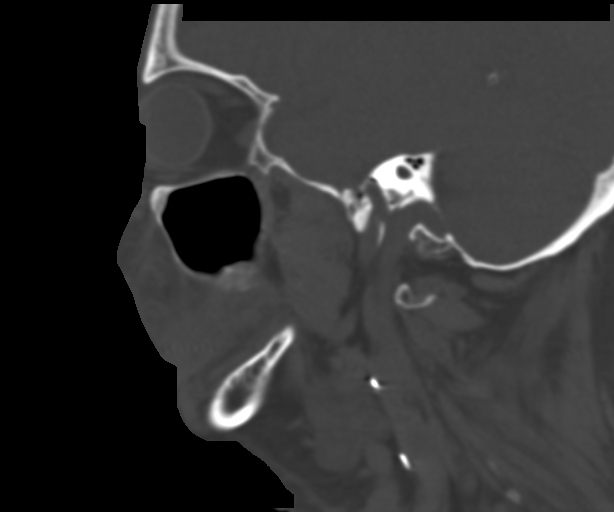

[15 of 47 positions shown; findings below may reference images not displayed]

FINDINGS: Left facial skin thickening and soft tissue edema. Scattered small
ill-defined soft tissue densities in the region of soft tissue edema
measuring up to 11 mm in the subcutaneous tissues. There is no fluid
collection or abscess. No soft tissue air. There are no bony
destructive changes. Patient is edentulous of the upper teeth, no
periapical lucencies about the in situ lower teeth. Both orbits and
globes are intact. There is no facial bone fracture. Right carotid
stent and surgical clips adjacent to the left carotid artery noted.
Paranasal sinuses and mastoid air cells are well aerated. Incidental
note of degenerative change in the included cervical spine with
degenerative-type anterolisthesis of C3 on C4 and C4 on C5.
IMPRESSION: Skin thickening and soft tissue edema about the left face,
consistent with stated history of cellulitis. There are small
rounded soft tissue densities in the region of edema, may reflect
lymph nodes, phlegmonous change versus less likely small hematomas.
There is no abscess or soft tissue air.

## 2022-08-13 DEATH — deceased
